# Patient Record
Sex: Male | Born: 1980 | Race: Black or African American | Hispanic: No | Marital: Single | Smoking: Never smoker
Health system: Southern US, Community
[De-identification: ages and names within clinical notes are randomized; demographics above are authoritative.]

## PROBLEM LIST (undated history)

## (undated) DIAGNOSIS — N200 Calculus of kidney: Secondary | ICD-10-CM

## (undated) DIAGNOSIS — B009 Herpesviral infection, unspecified: Secondary | ICD-10-CM

## (undated) HISTORY — DX: Herpesviral infection, unspecified: B00.9

## (undated) HISTORY — DX: Calculus of kidney: N20.0

## (undated) HISTORY — PX: NO PAST SURGERIES: SHX2092

---

## 2016-10-25 ENCOUNTER — Ambulatory Visit (INDEPENDENT_AMBULATORY_CARE_PROVIDER_SITE_OTHER): Payer: 59 | Admitting: Family Medicine

## 2016-10-25 ENCOUNTER — Telehealth: Payer: Self-pay | Admitting: Family Medicine

## 2016-10-25 ENCOUNTER — Encounter: Payer: Self-pay | Admitting: Family Medicine

## 2016-10-25 VITALS — BP 110/75 | HR 64 | Temp 97.8°F | Ht 63.75 in | Wt 179.2 lb

## 2016-10-25 DIAGNOSIS — Z09 Encounter for follow-up examination after completed treatment for conditions other than malignant neoplasm: Secondary | ICD-10-CM

## 2016-10-25 DIAGNOSIS — R0602 Shortness of breath: Secondary | ICD-10-CM | POA: Diagnosis not present

## 2016-10-25 DIAGNOSIS — Z23 Encounter for immunization: Secondary | ICD-10-CM | POA: Diagnosis not present

## 2016-10-25 NOTE — Telephone Encounter (Signed)
Patient dropped off FMLA form forward to paperwork bin

## 2016-10-25 NOTE — Patient Instructions (Signed)
Stay well hydrated with water.

## 2016-10-25 NOTE — Progress Notes (Signed)
Chief Complaint  Patient presents with  . Establish Care    pt was seen at [facility] last Thurs night for kidney stones-pt stated that he did pass the stone.       New Patient Visit SUBJECTIVE: HPI: Vernon Francis is an 36 y.o.male who is being seen for establishing care.  Seen at [facility] ED for L flank pain, found to have kidney stone. He passed one while giving a urine sample and passed another later. He feels much improved after that visit and is not having any urinary complaints.  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 He will sometimes sit under a vent at work and will have shortness of breath. He has asked his supervisor to move, however is not being accommodated. He does not have a hx of asthma or COPD, he does not smoke. He does not have shortness of breath other than when he sits under this vent. He is requesting a letter.  No Known Allergies  Past Medical History:  Diagnosis Date  . Kidney stones    Past Surgical History:  Procedure Laterality Date  . NO PAST SURGERIES     Social History   Social History  . Marital status: Single   Social History Main Topics  . Smoking status: Never Smoker  . Smokeless tobacco: Never Used  . Alcohol use No  . Drug use: No   Family History  Problem Relation Age of Onset  . Hypertension Mother   . Diabetes Father      Current Outpatient Prescriptions:  .  HYDROcodone-acetaminophen (NORCO/VICODIN) 5-325 MG tablet, Take 1 tablet by mouth as needed. , Disp: , Rfl:  .  ibuprofen (ADVIL,MOTRIN) 200 MG tablet, Take 200 mg by mouth every 6 (six) hours as needed., Disp: , Rfl:   ROS Resp: No current cough or SOB  GU:  Denies dysuria, hematuria   OBJECTIVE: BP 110/75 (BP Location: Left Arm, Patient Position: Sitting, Cuff Size: Large)   Pulse 64   Temp 97.8 F (36.6 C) (Oral)   Ht 5' 3.75" (1.619 m)   Wt 179 lb 3.2 oz (81.3 kg)   SpO2 99%   BMI 31.00 kg/m   Constitutional: -  VS reviewed -  Well  developed, well nourished, appears stated age -  No apparent distress  Psychiatric: -  Oriented to person, place, and time -  Memory intact -  Affect and mood normal -  Fluent conversation, good eye contact -  Judgment and insight age appropriate  Eye: -  Conjunctivae clear, no discharge -  Pupils symmetric, round, reactive to light  ENMT: -  Oral mucosa without lesions, tongue and uvula midline    Tonsils not enlarged, no erythema, no exudate, trachea midline    Pharynx moist, no lesions, no erythema  Neck: -  No gross swelling, no palpable masses -  Thyroid midline, not enlarged, mobile, no palpable masses  Cardiovascular: -  RRR, no murmurs -  No LE edema  Respiratory: -  Normal respiratory effort, no accessory muscle use, no retraction -  Breath sounds equal, no wheezes, no ronchi, no crackles  Gastrointestinal: -  Bowel sounds normal -  No tenderness, no distention, no guarding, no masses  Musculoskeletal: -  No clubbing, no cyanosis -  Gait normal -  No CVA tenderness  Skin: -  +L forearm body art with keloid formation -  Warm and dry to palpation   ASSESSMENT/PLAN: Follow-up for resolved condition  Shortness of breath  Patient instructed to sign release of records form from his previous PCP. Renal stone issue resolved. He has f/u with urology on Monday. Letter for work given stating that if he is having SOB from sitting under a vent, it would be beneficial for him to move to an area where the vent is not present and he is not having shortness of breath. Up date Tdap.  Patient should return in 1 year or prn. The patient voiced understanding and agreement to the plan.   Jilda Rocheicholas Paul IndianolaWendling, DO 10/25/16  8:29 AM

## 2016-10-25 NOTE — Addendum Note (Signed)
Addended by: Verdie ShireBAYNES, Jonothan Heberle M on: 10/25/2016 09:20 AM   Modules accepted: Orders

## 2016-10-29 DIAGNOSIS — N200 Calculus of kidney: Secondary | ICD-10-CM | POA: Insufficient documentation

## 2016-11-01 ENCOUNTER — Telehealth: Payer: Self-pay | Admitting: Family Medicine

## 2016-11-01 NOTE — Telephone Encounter (Signed)
[facility] [phone]) asking the pt to RTC regarding message below.//AB/CMA

## 2016-11-01 NOTE — Telephone Encounter (Signed)
Have him come in for appt. I thought this was a resolved issue. TY.

## 2016-11-01 NOTE — Telephone Encounter (Addendum)
Relation to EA:VWUJpt:self Call back [phone] Pharmacy:  Reason for call:  Patient was seen on 10/25/16 stating back pain has not improved, patient states HYDROcodone-acetaminophen (NORCO/VICODIN) 5-325 MG tablet  Is working and will run out soon, patient states he has a follow up U/S in 2 weeks to determine if 2nd kidney stone passed, please advise

## 2016-11-05 NOTE — Telephone Encounter (Signed)
[facility] @ 6:57pm @ [phone]) asking the pt to RTC regarding the message below.//AB/CMA

## 2016-11-05 NOTE — Telephone Encounter (Signed)
[facility] 7:56am @ [phone]) asking the pt to RTC regarding med refill request.//AB/CMA

## 2016-11-05 NOTE — Telephone Encounter (Signed)
Called and spoke with the pt and informed him of Dr. Hollie BeachWendling's message below.  Pt stated that he has an appt for a US on this Wed, which was ordered by [place]. [place]Hall [place].  After the US he will have an appt with Dr. Margo AyeHall.  All this took place on his appt with Dr. Margo AyeHall on (10/29/16).  Pt stated that he has 1 pill left.  Please advise.//AB/CMA

## 2016-11-05 NOTE — Telephone Encounter (Signed)
Then I will defer to Dr. Margo AyeHall for this. TY.

## 2016-11-07 NOTE — Telephone Encounter (Signed)
Patient returning call.

## 2016-11-08 NOTE — Telephone Encounter (Signed)
Unable to reach the pt by phone (playing phone tag with the pt).   LMOM @ 11:00am @ [phone]) apologizing to the pt for missing his calls.  LM informing the pt of the message below from Dr. Carmelia RollerWendling regarding the medication refill request.  Asked the pt to please give me a call back to let me know he received the message, and if he has any questions or concerns.//AB/CMA

## 2016-11-08 NOTE — Telephone Encounter (Signed)
Pt called back to say he received the message and there where not questions or concerns.  He stated that he has seen Dr. Margo AyeHall and everything was okay and the pain in his back is not coming from kidney stones.   Pt stated that the pain is just regular back pain which maybe coming from sitting at work.  He stated he has been taking Tylenol, so he does not need the Hydrocodone-acetaminophen.  Informed the pt if the Tylenol does not help then he will need to schedule an appt with Dr. Carmelia RollerWendling to evaluate his back pain.  Pt verbalized understanding and agreed.//AB/CMA

## 2016-11-14 NOTE — Telephone Encounter (Addendum)
Patient checking on the status of FMLA paperwork best # [phone], patient states deadline passed, please advise

## 2016-11-14 NOTE — Telephone Encounter (Signed)
Paperwork not received.  Called to make patient aware and request for another copy.  Left message for call back.

## 2016-11-14 NOTE — Telephone Encounter (Signed)
Pt returned called.  Pt made aware that paperwork was not received.  Pt stated he would either fax us another copy or drop it off first thing tomorrow morning.

## 2016-11-15 ENCOUNTER — Telehealth: Payer: Self-pay

## 2016-11-15 DIAGNOSIS — Z0279 Encounter for issue of other medical certificate: Secondary | ICD-10-CM

## 2016-11-15 NOTE — Telephone Encounter (Signed)
I have created letter for patient stating FMLA forms have been misplaced. I have attempted to contact patient to inform him that letter is in front office for pick up TL/CMA

## 2016-11-15 NOTE — Telephone Encounter (Addendum)
Called patient to make him aware that we received paperwork and has been forwarded to his provider.  Reason for FMLA:  Kidney stones for the 26th and 1/2 day for the 29th.  Pt also mentioned that he continues to experience intermittent right knee pain and back pain with prolonged sitting.  Pt states he's willing to come back in for an evaluation, but wanted to make sure provider was aware.

## 2016-11-15 NOTE — Telephone Encounter (Signed)
Patient dropped off FLMA forms again and physically handed to  [facility] RN. Patient requesting a letter from PCP stating that FMLA paperwork was missed placed. Patient states letter is  important due to patient being penalized 2 points from work if letter is not done. Patient will pick up FMLA paperwork when ready. Best [phone]

## 2016-12-12 ENCOUNTER — Ambulatory Visit (INDEPENDENT_AMBULATORY_CARE_PROVIDER_SITE_OTHER): Payer: 59 | Admitting: Family

## 2016-12-12 VITALS — BP 116/80 | HR 67 | Temp 98.0°F | Resp 18 | Ht 63.75 in | Wt 183.0 lb

## 2016-12-12 DIAGNOSIS — M25561 Pain in right knee: Secondary | ICD-10-CM | POA: Diagnosis not present

## 2016-12-12 MED ORDER — MELOXICAM 7.5 MG PO TABS: 7.5000 mg | ORAL_TABLET | Freq: Every day | ORAL | 0 refills | Status: DC

## 2016-12-12 NOTE — Patient Instructions (Addendum)
Please begin meloxicam once daily for knee pain.  You will be contacted about your referral to sports medicine.

## 2016-12-12 NOTE — Progress Notes (Signed)
Pre visit review using our clinic review tool, if applicable. No additional management support is needed unless otherwise documented below in the visit note. 
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
## 2016-12-14 ENCOUNTER — Telehealth: Payer: Self-pay | Admitting: *Deleted

## 2016-12-14 NOTE — Telephone Encounter (Signed)
Received completed and signed FMLA from Vernon Francis.  Spoke with the pt and informed him that his FMLA paperwork was completed and ready.  Pt stated that he will pick up the paperwork on (Mon-12/17/16).  Informed the pt that I will leave the forms up front.  Pt agreed.  Copy made to be scanned in the chart./AB/CMA

## 2016-12-14 NOTE — Telephone Encounter (Signed)
[facility] and [facility] @ 3:06pm @ [phone]) asking the pt to RTC regarding FMLA paperwork.//AB/CMA

## 2016-12-18 ENCOUNTER — Encounter: Payer: Self-pay | Admitting: Family Medicine

## 2016-12-18 ENCOUNTER — Ambulatory Visit (INDEPENDENT_AMBULATORY_CARE_PROVIDER_SITE_OTHER): Payer: 59 | Admitting: Family Medicine

## 2016-12-18 DIAGNOSIS — G8929 Other chronic pain: Secondary | ICD-10-CM | POA: Diagnosis not present

## 2016-12-18 DIAGNOSIS — M25561 Pain in right knee: Secondary | ICD-10-CM | POA: Diagnosis not present

## 2016-12-18 MED ORDER — DICLOFENAC SODIUM 75 MG PO TBEC: 75.0000 mg | DELAYED_RELEASE_TABLET | Freq: Two times a day (BID) | ORAL | 1 refills | Status: DC

## 2016-12-18 NOTE — Patient Instructions (Signed)
Your knee exam suggests patellar subluxation and small medial meniscus tear as causes of your pain. The rest of your exam is reassuring. Take diclofenac 75mg  twice a day with food for pain and inflammation for 7-10 days then as needed. Icing only if needed 15 minutes at a time. Knee brace or sleeve when up and walking around for support. Start physical therapy and do home exercises on days you don't go to therapy. Follow up with me in 6 weeks. If not improving as expected would consider MRI at that point.

## 2016-12-19 DIAGNOSIS — M25561 Pain in right knee: Secondary | ICD-10-CM | POA: Insufficient documentation

## 2016-12-19 NOTE — Assessment & Plan Note (Signed)
exam suggests patellar subluxation and medial meniscus tear.  Other ligamentous structures intact.  Start with diclofenac 75mg  twice a day with food, icing, knee brace or sleeve, and physical therapy.  F/u in 6 weeks.  Consider MRI if not improving as expected.
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
## 2016-12-27 NOTE — Telephone Encounter (Signed)
Pt came in office stating was missing page 4 and 5 with the [facility] paperwork. Pt dropped off copies of the entire package of FMLA but is only needing page 4 to be filled out and needing copy of page 5 that was already filed out but did not have a copy of it. Please call pt when document ready at [facility] [phone]. Documents given to [place].

## 2017-01-02 NOTE — Telephone Encounter (Signed)
Pt called requesting status of FMLA. Forms updated and placed at front desk for pt to pick up. Pt has been made aware. Copy placed in red folder for paperwork processing.

## 2017-01-02 NOTE — Telephone Encounter (Signed)
Patient informed FMLA paperwork will be ready for pick up today, patient voice understanding.

## 2017-01-29 ENCOUNTER — Ambulatory Visit: Payer: 59 | Admitting: Family Medicine

## 2017-06-17 ENCOUNTER — Telehealth: Payer: Self-pay | Admitting: Family Medicine

## 2017-06-17 ENCOUNTER — Encounter: Payer: Self-pay | Admitting: Family Medicine

## 2017-06-17 ENCOUNTER — Ambulatory Visit (INDEPENDENT_AMBULATORY_CARE_PROVIDER_SITE_OTHER): Payer: 59 | Admitting: Family Medicine

## 2017-06-17 VITALS — BP 120/72 | HR 64 | Temp 98.5°F | Ht 64.0 in | Wt 180.2 lb

## 2017-06-17 DIAGNOSIS — M25562 Pain in left knee: Secondary | ICD-10-CM | POA: Diagnosis not present

## 2017-06-17 DIAGNOSIS — M25561 Pain in right knee: Secondary | ICD-10-CM

## 2017-06-17 DIAGNOSIS — G8929 Other chronic pain: Secondary | ICD-10-CM | POA: Diagnosis not present

## 2017-06-17 NOTE — Patient Instructions (Signed)
If you do not hear anything about your physical therapy referral in the next 1-2 weeks, call our office and ask for an update.  Stretching and range of motion exercises These exercises warm up your muscles and joints and improve the movement and flexibility of your knee. These exercises also help to relieve pain and stiffness.  Exercise A: Knee flexion, active 1. Lie on your back with both knees straight. If this causes back discomfort, bend your uninjured knee so your foot is flat on the floor. 2. Slowly slide your left / right heel back toward your buttocks until you feel a gentle stretch in the front of your knee or thigh. Stop if you have pain. 3. Hold for 15-20 seconds. 4. Slowly slide your left / right heel back to the starting position. Repeat 2-3 times. Complete this exercise 1 time a day.  Exercise B: Knee extension, sitting 1. Sit with your left / right heel propped on a chair, a coffee table, or a footstool. Do not have anything under your knee to support it. 2. Allow your leg muscles to relax, letting gravity straighten out your knee. You should feel a stretch behind your left / right knee. 3. If told by your health care provide just above your kneecap. 4. Hold this position for 15-20 seconds. Repeat 2-3 times. Complete this stretch 1 time a day.  Strengthening exercises These exercises build strength and endurance in your knee. Endurance is the ability to use your muscles for a long time, even after they get tired.  Exercise C: Quadriceps, isometric 1. Lie on your back with your left / right leg extended and your other knee bent. Put a rolled towel or small pillow under your right/left knee if told by your health care provider. 2. Slowly tense the muscles in the front of your left / right thigh by pushing the back of your knee down. You should see your knee cap slide up toward your hip or see increased dimpling just above the knee. 3. For 15-20 seconds, keep the muscle as tight  as you can without increasing your pain. 4. Relax the muscles slowly and completely. Repeat 2-3 times. Complete this exercise 1 time a day. Exercise D: Straight leg raises (quadriceps) 1. Lie on your back with your left / right leg extended and your other knee bent. 2. Tense the muscles in the front of your left / right thigh. You should see your kneecap slide up or see increased dimpling just above the knee. 3. Keep these muscles tight as you raise your leg 4-6 inches (10-15 cm) off the floor. 4. Hold this position for 15-20 seconds. 5. Keep these muscles tense as you lower your leg. 6. Relax the muscles slowly and completely. Repeat 2-3 times. Complete this exercise 1 time a day.  Exercise E: Hamstring curls 1. On the floor or a bed, lie on your abdomen with your legs straight. Put a folded towel or small pillow under your left / right thigh, just above your kneecap. 2. Slowly bend your left / right knee as far as you can without pain. Keep your hips flat against the floor or bed. 3. Hold this position for 15-20 seconds. 4. Slowly lower your leg to the starting position. Repeat 2-3 times. Complete this exercise 1 time a day.  Stretching exercises These exercises warm up your muscles and joints and improve the movement and flexibility of your knee. These exercises also help to relieve pain and stiffness.  Exercise A:  Quadriceps, prone 1. Lie on your abdomen on a firm surface, such as a bed or padded floor. 2. Bend your left / right knee and hold your ankle. If you cannot reach your ankle or pant leg, loop a belt around your foot and grab the belt instead. 3. Gently pull your heel toward your buttocks. Your knee should not slide out to the side. You should feel a stretch in the front of your thigh and knee. 4. Hold this position for 15-20 seconds. Repeat 2-3 times. Complete this stretch 1-2 times a day.  Exercise B: Hamstring, doorway 1. Lie on your back in front of a doorway with your  left / right leg resting against the wall and your other leg flat on the floor in the doorway. There should be a slight bend in your left / right knee. 2. Straighten your left / right knee. You should feel a stretch behind your knee or thigh. If you do not feel that stretch, scoot your buttocks closer to the door. 3. Hold this position for 15-20 seconds. Repeat 2-3 times. Complete this stretch 1-2 times a day.  Strengthening exercises These exercises build strength and endurance in your knee and leg muscles. Endurance is the ability to use your muscles for a long time, even after they get tired. Exercise C: Step-ups (quadriceps) 1. Stand in front of a step, a thick book, or a step stool that is ~2-3 feet tall. 2. Slowly step up with your left / right foot, keeping your knee in line with your hip and foot. Do not let your knee bend so far that you cannot see your toes. Hold a wall or a counter for balance, but do not use it for support. 3. Slowly unlock your knee and lower yourself to the starting position using gravity, not your muscles. Repeat 2-3 times. Complete this exercise 1 time a day.  Exercise D: Wall slides (quadriceps) 1. Lean your back against a smooth wall or door, and walk your feet out 18-24 inches (45-61 cm) from it. 2. Place your feet hip-width apart. 3. Slowly slide down the wall or door until your knees bend 90 degrees. Keep your knees over your heels, not over your toes. Keep your knees in line with your hips. 4. Hold for 15-20 seconds. 5. Stand up to rest for 60 seconds. Repeat 2-3 times. Complete this exercise every other day.  Exercise E: Bridge (hip extensors) 1. Lie on your back on a firm surface with your knees bent and your feet flat on the floor. 2. Tighten your buttocks muscles and lift your bottom off the floor until your trunk is level with your thighs. ? Do not arch your back. ? You should feel the muscles working in your buttocks and the back of your  thighs. 3. Hold this position for 15-20 seconds. 4. Slowly lower your hips to the starting position. 5. Let your buttocks muscles relax completely between repetitions. Repeat 2-3 times. Complete this exercise 1 time a day.  Balance exercises This exercise improves or maintains your balance. Balance is important in preventing falls. Exercise F: Single leg stand 1. Without shoes, stand near a railing or in a doorway. You may hold onto the railing or door frame as needed. 2. Stand on your left / right foot. Keep your knee slightly bent and keep your big toe down on the floor. 3. If this exercise is too easy, you can try doing it with your eyes closed or while standing on  a pillow. 4. Hold this position for 15-20 seconds. Repeat 2-3 times. Complete this exercise 1 time a day.  This information is not intended to replace advice given to you by your health care provider. Make sure you discuss any questions you have with your health care provider.
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
## 2017-06-17 NOTE — Telephone Encounter (Signed)
Pt dropped off FMLA paperwork for Dr. Carmelia Roller to fill out, documents placed in tray at front office

## 2017-06-17 NOTE — Progress Notes (Signed)
Pre visit review using our clinic review tool, if applicable. No additional management support is needed unless otherwise documented below in the visit note. 

## 2017-06-19 NOTE — Telephone Encounter (Signed)
Patient returning call and states he would like to speak with you directly and will stop in the office tomorrow, patient states you call call him in the morning.

## 2017-06-19 NOTE — Telephone Encounter (Signed)
Completed as much as possible of paperwork; LMOM with contact name and number for return call from patient RE: to give information as to what he is needing on forms. Will hold until I receive return call/SLS 09/26

## 2017-06-19 NOTE — Telephone Encounter (Signed)
Relation to ZO:XWRU  Call back number: [phone]   Reason for call:  Patient requesting form reflect back date time patient states the date is on the form please speak with patient directly to clarify, please advise

## 2017-06-21 NOTE — Telephone Encounter (Signed)
Spoke to patient via phone about updates he needed on forms; I spoke with Dr. Carmelia Roller about these and they were made and forms completed with provider signature; I will have [place] email forms when she returns to office on 06/21/17//SLS 09/27

## 2017-06-26 ENCOUNTER — Ambulatory Visit: Payer: 59 | Admitting: Physical Therapy

## 2017-07-01 NOTE — Telephone Encounter (Signed)
Was unable to email the form as the link was for a scan in, therefore; I mailed the forms in, as this was the only other alternative on form

## 2017-07-02 ENCOUNTER — Ambulatory Visit: Payer: 59 | Attending: Family Medicine | Admitting: Physical Therapy

## 2017-07-02 DIAGNOSIS — M6281 Muscle weakness (generalized): Secondary | ICD-10-CM | POA: Insufficient documentation

## 2017-07-02 DIAGNOSIS — M25562 Pain in left knee: Secondary | ICD-10-CM | POA: Diagnosis present

## 2017-07-02 DIAGNOSIS — G8929 Other chronic pain: Secondary | ICD-10-CM | POA: Insufficient documentation

## 2017-07-02 DIAGNOSIS — M62838 Other muscle spasm: Secondary | ICD-10-CM | POA: Diagnosis present

## 2017-07-02 DIAGNOSIS — M25561 Pain in right knee: Secondary | ICD-10-CM | POA: Diagnosis not present
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
## 2017-07-04 NOTE — Telephone Encounter (Signed)
Vernon Francis Self [phone]  Vernon Francis would like you to call him back about this FMLA paperwork.

## 2017-07-04 NOTE — Telephone Encounter (Signed)
Patient emailed letter requesting additional information, email forwarded to sharon on 07/02/17 patient checking on the status,please advise

## 2017-07-05 ENCOUNTER — Ambulatory Visit: Payer: 59

## 2017-07-05 NOTE — Telephone Encounter (Signed)
Letter received via email is in reference to an absence from work on 05/17/17; I do not find a telephone note and/or OV for that date. Forwarded to provider for advisement on this matter/SLS 10/11

## 2017-07-05 NOTE — Telephone Encounter (Signed)
Called and spoke with patient about FMLA paperwork and explained that received letter from [employer] stating is was about an 05/17/17 absence. Explained that that date would be covered under the FMLA that Melissa completed for him in March [6-mths], Pt states that Melissa had to write a note that stated that "paperwork was filled out incorrectly" [not a letter that I can refer back to]. I also informed him that we cannot attest to an absence on that date because Dr. Carmelia Roller did not see patient in office until 06/17/17, but he did grant pt's request to note that this medical issue has been going on since 12/13/16 on additional information. Pt will contact his disability rep and try to get more clarification and call back with the information/SLS 10/12

## 2017-07-09 ENCOUNTER — Ambulatory Visit: Payer: 59 | Admitting: Physical Therapy

## 2017-07-12 ENCOUNTER — Ambulatory Visit: Payer: 59

## 2017-07-17 ENCOUNTER — Ambulatory Visit: Payer: 59 | Admitting: Physical Therapy

## 2017-07-17 DIAGNOSIS — M25561 Pain in right knee: Principal | ICD-10-CM

## 2017-07-17 DIAGNOSIS — M25562 Pain in left knee: Secondary | ICD-10-CM

## 2017-07-17 DIAGNOSIS — M62838 Other muscle spasm: Secondary | ICD-10-CM

## 2017-07-17 DIAGNOSIS — G8929 Other chronic pain: Secondary | ICD-10-CM

## 2017-07-17 DIAGNOSIS — M6281 Muscle weakness (generalized): Secondary | ICD-10-CM

## 2017-07-17 NOTE — Therapy (Addendum)
[facility] [address]  [place], [place], [address] Phone: [phone]   Fax:  [phone]  Physical Therapy Treatment  Patient Details  Name: Vernon Francis MRN: [id number] Date of Birth: Aug 16, 1981 Referring Provider: Riki Sheer, MD  Encounter Date: 07/17/2017      PT End of Session - 07/17/17 0851    Visit Number 2   Number of Visits 12   Date for PT Re-Evaluation 08/16/17   Authorization Type UHC   PT Start Time 0851   PT Stop Time 0937   PT Time Calculation (min) 46 min   Activity Tolerance Patient tolerated treatment well   Behavior During Therapy [facility] for tasks assessed/performed      Past Medical History:  Diagnosis Date  . Kidney stones     Past Surgical History:  Procedure Laterality Date  . NO PAST SURGERIES      There were no vitals filed for this visit.      Subjective Assessment - 07/17/17 0854    Subjective Pt reports pain is less than when he first came to PT. Has started working out at the gym again. Notes pain typically after working out. Reports trying to do the HEP stretches daily.   Patient Stated Goals "see if I can get the pain to go away"   Currently in Pain? No/denies   Pain Score 0-No pain   Pain Onset More than a month ago                         [facility] Adult PT Treatment/Exercise - 07/17/17 0851      Exercises   Exercises Knee/Hip     Knee/Hip Exercises: Stretches   Passive Hamstring Stretch Both;30 seconds;2 reps   Passive Hamstring Stretch Limitations supine with strap; strap at ball of foot with pull in DF for gastroc stretch   Quad Stretch Both;30 seconds;2 reps   Quad Stretch Limitations prone with strap & pillow under knee to emphasize RF & hip flexors   ITB Stretch Both;30 seconds;2 reps   ITB Stretch Limitations supine with strap   Piriformis Stretch Both;30 seconds;2 reps   Piriformis Stretch Limitations supine figure 4   Other  Knee/Hip Stretches foam rolling: B med/lat HS, B piriformis, B 3 way quads & B ITB x ~1' each     Knee/Hip Exercises: Aerobic   Recumbent Bike lvl 2 x 6'     Knee/Hip Exercises: Supine   Bridges with Ball Squeeze Both;15 reps;Strengthening  5" hold   Straight Leg Raise with External Rotation Both;15 reps;Strengthening   Straight Leg Raise with External Rotation Limitations 3#     Knee/Hip Exercises: Sidelying   Hip ADduction Both;15 reps;Strengthening   Hip ADduction Limitations 3#                PT Education - 07/17/17 0946    Education provided Yes   Education Details HEP update - foam rolling & initial strengthening program   Person(s) Educated Patient   Methods Explanation;Demonstration;Handout   Comprehension Verbalized understanding;Returned demonstration;Need further instruction             PT Long Term Goals - 07/17/17 0855      PT LONG TERM GOAL #1   Title Independent with ongoing HEP   Status On-going     PT LONG TERM GOAL #2   Title B hip and knee strength >/= 4+/5 w/o pain on resistance  Status On-going     PT LONG TERM GOAL #3   Title Pt will report ability to work 1/2 day at desk w/o limitation due to B knee pain   Status On-going     PT LONG TERM GOAL #4   Title Pt will resume normal workout w/o limitation due to B knee pain   Status On-going               Plan - 07/17/17 0856    Clinical Impression Statement Pt returning for first time over 2 weeks since eval. Reports good compliance with HEP stretches and improving flexibility noted, although still tightest in hip flexors & ITB. Introduced foam rolling to further reduce muscle tension/tightness. Reports pain is better, but still experiencing pain typically after working out at the gym. Initiated basic strengthening program and will progress as of next visit.   Rehab Potential Good   PT Treatment/Interventions Patient/family education;ADLs/Self Care Home Management;Neuromuscular  re-education;Therapeutic exercise;Manual techniques;Dry needling;Taping;Therapeutic activities;Electrical Stimulation;Cryotherapy;Vasopneumatic Device;Iontophoresis 33m/ml Dexamethasone   Consulted and Agree with Plan of Care Patient      Patient will benefit from skilled therapeutic intervention in order to improve the following deficits and impairments:  Pain, Impaired flexibility, Impaired tone, Increased muscle spasms, Decreased strength, Decreased range of motion, Postural dysfunction  Visit Diagnosis: Chronic pain of right knee  Chronic pain of left knee  Other muscle spasm  Muscle weakness (generalized)     Problem List Patient Active Problem List   Diagnosis Date Noted  . Right knee pain 12/19/2016  . Nephrolithiasis 10/29/2016    JPercival Spanish PT, MPT 07/17/2017, 9:51 AM  [facility] [place] [address]Phone: [phone]  Fax:  [phone] Name: Vernon KilgallonMRN: [id number] of Birth: 105-Dec-1982  PHYSICAL THERAPY DISCHARGE SUMMARY  Visits from Start of Care: 2  Current functional level related to goals / functional outcomes:   Unable to formally assess as pt failed to return for further visits beyond initial treatment visit. Pt being discharged per policy  due to >3 NS/Cx.   Remaining deficits:   As above.   Education / Equipment:   HEP  Plan: Patient agrees to discharge.  Patient goals were not met. Patient is being discharged due to not returning since the last visit.  ?????     JPercival Spanish PT, MPT 08/06/17, 8:24 AM  [facility] [place] [address]Phone: [phone]  Fax:  [phone]

## 2017-07-22 ENCOUNTER — Ambulatory Visit: Payer: 59 | Admitting: Physical Therapy

## 2017-07-25 ENCOUNTER — Ambulatory Visit: Payer: 59 | Attending: Family Medicine | Admitting: Physical Therapy

## 2017-09-11 ENCOUNTER — Ambulatory Visit (INDEPENDENT_AMBULATORY_CARE_PROVIDER_SITE_OTHER): Payer: PRIVATE HEALTH INSURANCE | Admitting: Licensed Clinical Social Worker

## 2017-09-11 DIAGNOSIS — F321 Major depressive disorder, single episode, moderate: Secondary | ICD-10-CM

## 2017-10-14 ENCOUNTER — Ambulatory Visit (INDEPENDENT_AMBULATORY_CARE_PROVIDER_SITE_OTHER): Payer: PRIVATE HEALTH INSURANCE | Admitting: Licensed Clinical Social Worker

## 2017-10-14 DIAGNOSIS — F321 Major depressive disorder, single episode, moderate: Secondary | ICD-10-CM | POA: Diagnosis not present

## 2017-10-16 ENCOUNTER — Encounter: Payer: Self-pay | Admitting: Family Medicine

## 2017-10-16 ENCOUNTER — Ambulatory Visit (INDEPENDENT_AMBULATORY_CARE_PROVIDER_SITE_OTHER): Payer: 59 | Admitting: Family Medicine

## 2017-10-16 VITALS — BP 112/78 | HR 65 | Temp 98.4°F | Ht 64.0 in | Wt 186.0 lb

## 2017-10-16 DIAGNOSIS — F418 Other specified anxiety disorders: Secondary | ICD-10-CM | POA: Diagnosis not present

## 2017-10-16 NOTE — Patient Instructions (Addendum)
Please consider counseling. Contact [phone] to schedule an appointment or inquire about cost/insurance coverage.  Contact your team at work regarding getting us any necessary paperwork.   Coping skills Choose 5 that work for you:  Take a deep breath  Count to 20  Read a book  Do a puzzle  Meditate  Bake  Sing  Knit  Garden  Pray  Go outside  Call a friend  Listen to music  Take a walk  Color  Send a note  Take a bath  Watch a movie  Be alone in a quiet place  Pet an animal  Visit a friend  Journal  Exercise  Stretch   Let us know if you need anything.

## 2017-10-16 NOTE — Progress Notes (Signed)
Chief Complaint  Patient presents with  . Follow-up    Subjective: Patient is a 37 y.o. male here for anxiety.  This issue has been going on for approximately 1 year.  Almost 3 months ago, he had his first born son and ever since then, things have been getting more stressful in his life.  His appetite will fluctuate and his sleep is poor.  He has some feelings of depression and anxiety, however his performance at work is being affected the most.  Concentration is not good.  There is a program through his job that allows him to take 30 or 60 days off to see if his health can improve.  He wishes to try to take 30 days off rather than start a medication.  He is open to the latter option if issues immediately return after going back to work.  Objective: BP 112/78 (BP Location: Left Arm, Patient Position: Sitting, Cuff Size: Normal)   Pulse 65   Temp 98.4 F (36.9 C) (Oral)   Ht 5\' 4"  (1.626 m)   Wt 186 lb (84.4 kg)   SpO2 97%   BMI 31.93 kg/m  General: Awake, appears stated age Lungs: No accessory muscle use Psych: Age appropriate judgment and insight, normal affect and mood  Assessment and Plan: Situational anxiety  We discussed medication, I am okay if he wishes to hold off on this. # for [facility] provided as well as options for managing feelings of anxiety. OK to fill out form, he will let us know.  F/u in 8 weeks, this should give me enough time to fill out his form and for him to try to have 30 days off.  I would like to see him after he has returned to work. The patient voiced understanding and agreement to the plan.  Jilda Rocheicholas Paul FowlertonW[place], DO 10/16/17  8:43 AM

## 2017-10-16 NOTE — Progress Notes (Signed)
Pre visit review using our clinic review tool, if applicable. No additional management support is needed unless otherwise documented below in the visit note. 

## 2017-10-17 ENCOUNTER — Telehealth: Payer: Self-pay | Admitting: Family Medicine

## 2017-10-17 NOTE — Telephone Encounter (Signed)
Pt dropped off disability paperwork, documents placed in tray at front office

## 2017-10-22 NOTE — Telephone Encounter (Signed)
Numbered paperwork and forwarded to provider d/t the [employer] paperwork stating, "disability claim requesting ... absence commencing 10/25/2017 [?]/SLS 01/29

## 2017-10-23 ENCOUNTER — Telehealth: Payer: Self-pay | Admitting: *Deleted

## 2017-10-23 NOTE — Telephone Encounter (Signed)
[facility]; forwarded to provider/SLS 01/30

## 2017-10-25 NOTE — Telephone Encounter (Signed)
Paperwork faxed °

## 2017-10-25 NOTE — Telephone Encounter (Signed)
LMOM with contact name and number [for return call, if needed] RE: paperwork ready for p/u during regular business hours/SLS 02/01

## 2017-11-07 ENCOUNTER — Ambulatory Visit: Payer: Self-pay | Admitting: Family Medicine

## 2017-11-07 NOTE — Telephone Encounter (Signed)
Pt called in to follow up on paperwork. Pt says that he was told by Disability that he dropped off the incorrect form. [employer] faxed over the correct form today. Pt would like to make provider aware to be looking out for it for completion. Pt says that form need to be sent over to therapist Berniece AndreasJulie Whitt also for completion. Pt says that he made front office aware when he dropped off previous paperwork.    Did make pt aware that other paperwork is up front for pick up.   Please assist further.

## 2017-11-11 ENCOUNTER — Ambulatory Visit (INDEPENDENT_AMBULATORY_CARE_PROVIDER_SITE_OTHER): Payer: PRIVATE HEALTH INSURANCE | Admitting: Licensed Clinical Social Worker

## 2017-11-11 DIAGNOSIS — F321 Major depressive disorder, single episode, moderate: Secondary | ICD-10-CM | POA: Diagnosis not present

## 2017-11-14 ENCOUNTER — Telehealth: Payer: Self-pay | Admitting: *Deleted

## 2017-11-14 NOTE — Telephone Encounter (Signed)
Copied from CRM #[id number]. Topic: General - Other >> Nov 11, 2017 10:54 AM Percival [phone] Cheryl W wrote:  Pt call to ask if Dr Carmelia RollerWendling has [facility]. Pt was informed by her that the forms was sent on 11/07/17   Pt phone [phone]  >> Nov 13, 2017 11:05 AM Gerrianne ScalePayne, Angela L wrote: Patient calling back about his disability forms he want to know if the practice had received the form he states that it need to be sent in before 11-21-17

## 2017-11-14 NOTE — Telephone Encounter (Signed)
I do not have any forms except for the set that came in on 10/17/17 by patient and was completed and faxed out on 10/23/17. I did receive medical records from [facility]; forwarded to provider on 10/23/17:  Orlene Ermonversation  (Newest Message First)  Me    10/23/17 9:50 AM  Note    Received Medical records from [facility]; forwarded to provider/SLS 01/30

## 2017-11-15 ENCOUNTER — Ambulatory Visit: Payer: Self-pay | Admitting: Family Medicine

## 2017-11-15 NOTE — Telephone Encounter (Signed)
Received FMLA/STDmedical Request paperwork from [facility] claims Management, completed as much as possible; forwarded to provider [pt No Show &Cancel 02/22] /SLS02/22

## 2017-11-18 ENCOUNTER — Ambulatory Visit: Payer: Self-pay | Admitting: Internal Medicine

## 2017-11-18 ENCOUNTER — Ambulatory Visit (INDEPENDENT_AMBULATORY_CARE_PROVIDER_SITE_OTHER): Payer: PRIVATE HEALTH INSURANCE | Admitting: Licensed Clinical Social Worker

## 2017-11-18 ENCOUNTER — Ambulatory Visit: Payer: 59 | Admitting: Family Medicine

## 2017-11-18 DIAGNOSIS — F324 Major depressive disorder, single episode, in partial remission: Secondary | ICD-10-CM

## 2017-11-18 DIAGNOSIS — Z0289 Encounter for other administrative examinations: Secondary | ICD-10-CM

## 2017-11-19 ENCOUNTER — Encounter: Payer: Self-pay | Admitting: Family Medicine

## 2017-11-20 ENCOUNTER — Encounter: Payer: Self-pay | Admitting: Family Medicine

## 2017-11-20 ENCOUNTER — Ambulatory Visit (INDEPENDENT_AMBULATORY_CARE_PROVIDER_SITE_OTHER): Payer: 59 | Admitting: Family Medicine

## 2017-11-20 VITALS — BP 112/70 | HR 73 | Temp 98.2°F | Ht 64.0 in | Wt 183.4 lb

## 2017-11-20 DIAGNOSIS — J069 Acute upper respiratory infection, unspecified: Secondary | ICD-10-CM | POA: Diagnosis not present

## 2017-11-20 DIAGNOSIS — B9789 Other viral agents as the cause of diseases classified elsewhere: Secondary | ICD-10-CM | POA: Diagnosis not present

## 2017-11-20 LAB — POCT RAPID STREP A (OFFICE): RAPID STREP A SCREEN: NEGATIVE

## 2017-11-20 NOTE — Progress Notes (Signed)
Pre visit review using our clinic review tool, if applicable. No additional management support is needed unless otherwise documented below in the visit note. 

## 2017-11-20 NOTE — Patient Instructions (Signed)
Continue to push fluids, practice good hand hygiene, and cover your mouth if you cough.  If you start having fevers, shaking or shortness of breath, seek immediate care.  Ibuprofen 400-600 mg (2-3 over the counter strength tabs) every 6 hours as needed for pain.  OK to take Tylenol 1000 mg (2 extra strength tabs) or 975 mg (3 regular strength tabs) every 6 hours as needed.  Let us know if you need anything.  

## 2017-11-20 NOTE — Progress Notes (Signed)
Chief Complaint  Patient presents with  . Sore Throat  . Cough    Harlow MaresJonathan Perryman here for URI complaints.  Duration: 4 days  Associated symptoms: sinus congestion, rhinorrhea, sore throat and cough Denies: sinus pain, itchy watery eyes, ear pain, ear drainage, wheezing, shortness of breath, myalgia and fevers Treatment to date: lemon-honey, Day/Nyquil, Mucinex Sick contacts: Yes  ROS:  Const: Denies fevers HEENT: As noted in HPI Lungs: No SOB  Past Medical History:  Diagnosis Date  . Kidney stones    Family History  Problem Relation Age of Onset  . Hypertension Mother   . Diabetes Father     BP 112/70 (BP Location: Left Arm, Patient Position: Sitting, Cuff Size: Normal)   Pulse 73   Temp 98.2 F (36.8 C) (Oral)   Ht 5\' 4"  (1.626 m)   Wt 183 lb 6 oz (83.2 kg)   SpO2 97%   BMI 31.48 kg/m  General: Awake, alert, appears stated age HEENT: AT, [place], ears patent b/l and TM's neg, nares patent w/o discharge, pharynx mildly erythematous and without asymmetry/exudates, MMM Neck: No masses or asymmetry Heart: RRR Lungs: CTAB, no accessory muscle use Psych: Age appropriate judgment and insight, normal mood and affect  Viral URI with cough  Supp care, ibuprofen, Tylenol. Continue to push fluids, practice good hand hygiene, cover mouth when coughing. F/u prn. If starting to experience fevers, shaking, or shortness of breath, seek immediate care. Pt voiced understanding and agreement to the plan.  Jilda Rocheicholas Paul [place] Creek[place]end[place], DO 11/20/17 10:49 AM

## 2017-11-25 ENCOUNTER — Encounter: Payer: Self-pay | Admitting: Family Medicine

## 2017-11-25 ENCOUNTER — Ambulatory Visit (INDEPENDENT_AMBULATORY_CARE_PROVIDER_SITE_OTHER): Payer: 59 | Admitting: Family Medicine

## 2017-11-25 VITALS — BP 120/80 | HR 75 | Temp 98.4°F | Ht 64.0 in | Wt 183.0 lb

## 2017-11-25 DIAGNOSIS — F418 Other specified anxiety disorders: Secondary | ICD-10-CM

## 2017-11-25 NOTE — Progress Notes (Signed)
Pre visit review using our clinic review tool, if applicable. No additional management support is needed unless otherwise documented below in the visit note. 

## 2017-11-25 NOTE — Telephone Encounter (Signed)
Copied from CRM [phone]. Topic: Inquiry >> Nov 22, 2017  1:22 PM Windy KalataMichael, Taylor L, NT wrote: Patient has a apt on 11/25/17 with Dr. Carmelia RollerWendling. She said for me to inform the nurse to not send out his disability paper work until after the appt on 11/25/17. Please advise.  Paperwork has been forwarded to Dr. Carmelia RollerWendling for 11/25/17 appt; appears to be same paperwork received before, and a second copy received was placed in box [11/22/17] for [employer] Br[employer]ie Whitt, as instructed to do in previous note/SLS 03/04

## 2017-11-25 NOTE — Progress Notes (Signed)
Chief Complaint  Patient presents with  . Follow-up    paper work   Pt is here for f/u situational anxiety. He was seen in late Jan for this issue. He is currently following w the behavioral health team for counseling and is finding it helpful. He did not start medicine, is still not interested. He has a young son that serves as a stressor. He does not have any conflicts with ppl at work. He was initially wrote off for 30 days, but was only given 19. He is requesting another extension for 30 d in anticipation of getting another 5219. He is doing better but still dealing with "balancing things out".   ROS: Psych: +anxiety  BP 120/80 (BP Location: Left Arm, Patient Position: Sitting, Cuff Size: Normal)   Pulse 75   Temp 98.4 F (36.9 C) (Oral)   Ht 5\' 4"  (1.626 m)   Wt 183 lb (83 kg)   SpO2 95%   BMI 31.41 kg/m  Gen- awake, alert Lungs- No access muscle use Psych- Age appropriate judgment and insight, nml mood and affect  Situational anxiety  He again declines medication. OK to stay with counseling team. Will give him another 30 d, return on 12/29/17. Form filled out, will scan and send.  F/u prn.  Pt voiced understanding and agreement to the plan.  Jilda Rocheicholas Paul Wendling 12:06 PM 11/25/17

## 2017-11-26 ENCOUNTER — Ambulatory Visit (INDEPENDENT_AMBULATORY_CARE_PROVIDER_SITE_OTHER): Payer: PRIVATE HEALTH INSURANCE | Admitting: Licensed Clinical Social Worker

## 2017-11-26 DIAGNOSIS — F324 Major depressive disorder, single episode, in partial remission: Secondary | ICD-10-CM

## 2017-11-28 NOTE — Telephone Encounter (Signed)
Vernon Francis calling because they need more details as far as what was observed that demonstrates that he's disabled/impaired in mental status? They need this information from his PCP Dr. Carmelia RollerWendling. They will be contacting Vernon Francis as well, for additional information. Fax: [phone]

## 2017-11-29 NOTE — Telephone Encounter (Signed)
Pt is having situational anxiety which can affect concentration/organization. This is his biggest issue. He does not wish to go on medication for this and is following with a Veterinary surgeoncounselor. I do not have observations from OV's for this. TY.

## 2017-12-04 ENCOUNTER — Telehealth: Payer: Self-pay | Admitting: Family Medicine

## 2017-12-04 NOTE — Telephone Encounter (Signed)
Copied from CRM [id number]. Topic: Inquiry >> Dec 04, 2017  2:53 PM Windy KalataMichael, Taylor L, NT wrote: Dr. Emelda BrothersGivens is calling and would like to leave a message for Dr. Carmelia RollerWendling to return his call in regards to disability.  Cb# [phone]

## 2017-12-11 ENCOUNTER — Telehealth: Payer: Self-pay | Admitting: Family Medicine

## 2017-12-11 NOTE — Telephone Encounter (Signed)
Called number and left message. TY.

## 2017-12-11 NOTE — Telephone Encounter (Signed)
Patient is calling to confirm that the office received a fax from his job. He has a appt tomorrow 12/12/17 at 7/15am. He would like for the paper work to wait and be filled out while he is there. Thank you.

## 2017-12-11 NOTE — Telephone Encounter (Signed)
Pt called - and said that his fmla company has not gotten all the info needed for his fmla, and that if it is not received today, the pt will lose his job.  I had the flow coordinator at [place] looking into it, and the pt was on hold when he hung up.   Please call pt back and let him know status - [phone]

## 2017-12-11 NOTE — Telephone Encounter (Signed)
Copied from CRM [phone]. Topic: Inquiry >> Dec 11, 2017 12:23 PM Windy KalataMichael, Judd Mccubbin L, NT wrote: Patient is calling and states his disability form was denied and states he needs Dr. Carmelia RollerWendling nurse to contact him today. When he was seen on 11/25/17 there was no clinical notes.

## 2017-12-12 ENCOUNTER — Encounter: Payer: Self-pay | Admitting: Family Medicine

## 2017-12-12 ENCOUNTER — Telehealth: Payer: Self-pay | Admitting: Family Medicine

## 2017-12-12 ENCOUNTER — Ambulatory Visit (INDEPENDENT_AMBULATORY_CARE_PROVIDER_SITE_OTHER): Payer: 59 | Admitting: Family Medicine

## 2017-12-12 VITALS — BP 120/80 | HR 70 | Temp 98.3°F | Ht 64.0 in | Wt 190.0 lb

## 2017-12-12 DIAGNOSIS — K219 Gastro-esophageal reflux disease without esophagitis: Secondary | ICD-10-CM | POA: Diagnosis not present

## 2017-12-12 DIAGNOSIS — J4520 Mild intermittent asthma, uncomplicated: Secondary | ICD-10-CM

## 2017-12-12 MED ORDER — ALBUTEROL SULFATE 108 (90 BASE) MCG/ACT IN AEPB
1.0000 | INHALATION_SPRAY | Freq: Four times a day (QID) | RESPIRATORY_TRACT | 1 refills | Status: DC | PRN
Start: 2017-12-12 — End: 2018-12-12

## 2017-12-12 MED ORDER — OMEPRAZOLE 20 MG PO CPDR: 20.0000 mg | DELAYED_RELEASE_CAPSULE | Freq: Two times a day (BID) | ORAL | 1 refills | Status: DC

## 2017-12-12 MED ORDER — ALBUTEROL SULFATE 108 (90 BASE) MCG/ACT IN AEPB: 1.0000 | INHALATION_SPRAY | Freq: Four times a day (QID) | RESPIRATORY_TRACT | 1 refills | Status: DC | PRN

## 2017-12-12 NOTE — Progress Notes (Signed)
Chief Complaint  Patient presents with  . Follow-up    paperwork    Subjective: Patient is a 37 y.o. male here for f/u paperwork.  The patient is here to follow-up on paperwork.  He initially was diagnosed with situational anxiety by this office.  Medication was offered as well as counseling.  He opted for the latter.  The counselor who he is started seeing is not filled out paperwork for him yet.  He is in the process of contacting that office.  Patient is expressed for over a year now, he has been experiencing intermittent shortness of breath and chest tightness when he is exposed to cold air.  He denies any personal history of allergies or asthma.  He has never tried anything other than avoiding the cold air.  He will sometimes cough.  No other upper respiratory symptoms.  Also for around a year, he has been having burning in his chest.  He notices this most when he lays down.  He is tried taking Tums which is helpful.  He has not tried any other medicines.  This happens intermittently.  He has not noticed any foods contributing to these symptoms.  ROS: GI: no current pain Lungs: Denies current SOB   Family History  Problem Relation Age of Onset  . Hypertension Mother   . Diabetes Father    Past Medical History:  Diagnosis Date  . Kidney stones    No Known Allergies  Current Outpatient Medications:  .  diclofenac (VOLTAREN) 75 MG EC tablet, Take 1 tablet (75 mg total) by mouth 2 (two) times daily., Disp: 60 tablet, Rfl: 1 .  Albuterol Sulfate 108 (90 Base) MCG/ACT AEPB, Inhale 1-2 puffs into the lungs every 6 (six) hours as needed (wheezing/shortness of breath)., Disp: 1 each, Rfl: 1 .  omeprazole (PRILOSEC) 20 MG capsule, Take 1 capsule (20 mg total) by mouth 2 (two) times daily before a meal., Disp: 60 capsule, Rfl: 1     Objective: BP 120/80 (BP Location: Left Arm, Patient Position: Sitting, Cuff Size: Normal)   Pulse 70   Temp 98.3 F (36.8 C) (Oral)   Ht 5\' 4"  (1.626  m)   Wt 190 lb (86.2 kg)   SpO2 97%   BMI 32.61 kg/m  General: Awake, appears stated age HEENT: MMM, EOMi Heart: RRR, no bruits, no LE edema Lungs: CTAB, no rales, wheezes or rhonchi. No accessory muscle use Abd: BS+, soft, NT, ND, no masses or organomegaly Psych: Age appropriate judgment and insight, flat affect and nml mood  Assessment and Plan: Gastroesophageal reflux disease, esophagitis presence not specified - Plan: omeprazole (PRILOSEC) 20 MG capsule  Mild intermittent extrinsic asthma without complication - Plan: Albuterol Sulfate 108 (90 Base) MCG/ACT AEPB  Orders as above. He has not brought these issues to my attention before. Will trial 2 mo of PPI for reflux issue.  Albuterol prn for likely reactive airway/extrinsic asthma.  Will review FMLA paperwork, likely needs counseling team's info as he told me none of this last time, which he admits.  F/u as originally scheduled for CPE. The patient voiced understanding and agreement to the plan.  Jilda Rocheicholas Paul [place], DO 12/12/17  8:55 AM

## 2017-12-12 NOTE — Telephone Encounter (Signed)
Provider has [employer] Dis[employer] paperwork requesting copies of OV, Imaging, Labs, Hospital visits. They are requesting, as other Insurance has done this year, proof that documentation of original FMLA/LOA Disability forms to be substantiated with chart documentation/SLS 03/21

## 2017-12-12 NOTE — Telephone Encounter (Signed)
Provider has [employer] Dis[employer] paperwork requesting copies of OV, Imaging, Labs, Hospital visits. They are requesting, as other Insurance has done this year, proof that documentation of original FMLA/LOA Disability forms to be substantiated with chart documentation/SLS 03/21 

## 2017-12-12 NOTE — Progress Notes (Signed)
Pre visit review using our clinic review tool, if applicable. No additional management support is needed unless otherwise documented below in the visit note. 

## 2017-12-12 NOTE — Patient Instructions (Addendum)
Use the inhaler before you know you are going to be exposed to cold air or if you get short of breath.  Use the reflux medication daily for the next 2 mo.   I think the best thing for your case is to get paperwork from [employer]. We will send over your office visit from today detailing what you told us.  Let us know if you need anything.

## 2017-12-12 NOTE — Telephone Encounter (Signed)
Copied from CRM [id number]. Topic: Quick Communication - See Telephone Encounter >> Dec 12, 2017  5:17 PM Trula SladeWalter, Linda F wrote: CRM for notification. See Telephone encounter for: 12/12/17. Patient stated that his preferred [facility], [facility] [address] [place] did not received the following two prescriptions:  1) Albuterol Sulfate 108 (90 Base) MCG/ACT AEPB  2) omeprazole (PRILOSEC) 20 MG capsule

## 2017-12-13 ENCOUNTER — Encounter: Payer: Self-pay | Admitting: Family Medicine

## 2017-12-16 ENCOUNTER — Ambulatory Visit: Payer: 59 | Admitting: Family Medicine

## 2017-12-16 ENCOUNTER — Ambulatory Visit (INDEPENDENT_AMBULATORY_CARE_PROVIDER_SITE_OTHER): Payer: PRIVATE HEALTH INSURANCE | Admitting: Licensed Clinical Social Worker

## 2017-12-16 DIAGNOSIS — F321 Major depressive disorder, single episode, moderate: Secondary | ICD-10-CM | POA: Diagnosis not present

## 2017-12-19 ENCOUNTER — Telehealth: Payer: Self-pay | Admitting: Family Medicine

## 2017-12-19 NOTE — Telephone Encounter (Signed)
Patient dropped off paperwork. Patient also attach a letter for you -sharon**. Paperwork placed in front office tray.

## 2017-12-20 ENCOUNTER — Telehealth: Payer: Self-pay | Admitting: Family Medicine

## 2017-12-20 NOTE — Telephone Encounter (Signed)
Copied from CRM [id number]. Topic: General - Other >> Dec 20, 2017  9:11 AM Cipriano BunkerLambe, Annette S wrote: Reason for CRM:   pt. Is needing a note from doctor to be able to go back to work which includes if any limitations. He is wanting to go back to work on Monday April 1st.    Please call him and let know when ready to pick up. Would like to pick up today if possible.  FMLA is from Jan. 23  through today march 29th    His insurance denied claim and he is having appeal, but needs income.  His health is the same.

## 2017-12-20 NOTE — Telephone Encounter (Signed)
I have called the number below and the rep I spoke stated they did not need anything from his PCP, but they are in contact with his therapist.

## 2017-12-20 NOTE — Telephone Encounter (Signed)
He is asking us to make a phone call before writing a letter

## 2017-12-20 NOTE — Telephone Encounter (Signed)
Can you call and see what they need? I might have time before the end of the day. TY.

## 2017-12-20 NOTE — Telephone Encounter (Signed)
Caller Name: Harlow MaresJonathan Coke Phone: [phone]  Pt called stating that his insurance claim denied and would like us to contact the appeals specialist before writing letter as mentioned below.   Appeals Specialist: Marcelina MorelJeanine Thiasher Ph# [phone] (customer service & ask for Yehuda MaoJeanine) Emp ID [id number] Case# [id number]  Pt has had no pay for 3 weeks and asking if someone can call today please.

## 2017-12-20 NOTE — Telephone Encounter (Signed)
OK to write letter. TY.

## 2017-12-27 ENCOUNTER — Ambulatory Visit (HOSPITAL_BASED_OUTPATIENT_CLINIC_OR_DEPARTMENT_OTHER)
Admission: RE | Admit: 2017-12-27 | Discharge: 2017-12-27 | Disposition: A | Discharge status: Home / Self Care | Payer: 59 | Source: Ambulatory Visit | Attending: Family Medicine | Admitting: Family Medicine

## 2017-12-27 ENCOUNTER — Encounter: Payer: Self-pay | Admitting: Family Medicine

## 2017-12-27 ENCOUNTER — Ambulatory Visit (INDEPENDENT_AMBULATORY_CARE_PROVIDER_SITE_OTHER): Payer: 59 | Admitting: Family Medicine

## 2017-12-27 VITALS — BP 120/84 | HR 65 | Temp 98.5°F | Ht 64.0 in | Wt 186.1 lb

## 2017-12-27 DIAGNOSIS — M79662 Pain in left lower leg: Secondary | ICD-10-CM

## 2017-12-27 DIAGNOSIS — Z0000 Encounter for general adult medical examination without abnormal findings: Secondary | ICD-10-CM | POA: Diagnosis not present

## 2017-12-27 LAB — COMPREHENSIVE METABOLIC PANEL
ALK PHOS: 58 U/L (ref 39–117)
ALT: 31 U/L (ref 0–53)
AST: 23 U/L (ref 0–37)
Albumin: 4.3 g/dL (ref 3.5–5.2)
BILIRUBIN TOTAL: 0.7 mg/dL (ref 0.2–1.2)
BUN: 19 mg/dL (ref 6–23)
CO2: 30 mEq/L (ref 19–32)
Calcium: 9.5 mg/dL (ref 8.4–10.5)
Chloride: 103 mEq/L (ref 96–112)
Creatinine, Ser: 1.03 mg/dL (ref 0.40–1.50)
GFR: 103.69 mL/min (ref >60.00)
GLUCOSE: 87 mg/dL (ref 70–99)
Potassium: 3.6 mEq/L (ref 3.5–5.1)
SODIUM: 138 meq/L (ref 135–145)
TOTAL PROTEIN: 7.6 g/dL (ref 6.0–8.3)

## 2017-12-27 LAB — LIPID PANEL
Cholesterol: 208 mg/dL — ABNORMAL HIGH (ref 0–200)
HDL: 42.2 mg/dL (ref >39.00)
LDL Cholesterol: 147 mg/dL — ABNORMAL HIGH (ref 0–99)
NONHDL: 166.25
Total CHOL/HDL Ratio: 5
Triglycerides: 94 mg/dL (ref 0.0–149.0)
VLDL: 18.8 mg/dL (ref 0.0–40.0)
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
## 2017-12-27 NOTE — Progress Notes (Signed)
Pre visit review using our clinic review tool, if applicable. No additional management support is needed unless otherwise documented below in the visit note. 

## 2017-12-27 NOTE — Patient Instructions (Signed)
Keep up the good work with exercising.  Keep the diet clean.  We will let you know the results of your X-ray today with further recommendations.  Give us 2-3 business days to get the results of your labs back. If labs are normal, you will likely receive a letter in the mail unless you have MyChart. This can take longer than 2-3 business days.   Let us know if you need anything.

## 2017-12-30 ENCOUNTER — Ambulatory Visit (INDEPENDENT_AMBULATORY_CARE_PROVIDER_SITE_OTHER): Payer: PRIVATE HEALTH INSURANCE | Admitting: Licensed Clinical Social Worker

## 2017-12-30 DIAGNOSIS — F321 Major depressive disorder, single episode, moderate: Secondary | ICD-10-CM | POA: Diagnosis not present

## 2018-01-02 NOTE — Telephone Encounter (Signed)
Patient checking status of paperwork, will need by 01/09/18 Call back [phone]

## 2018-01-02 NOTE — Telephone Encounter (Signed)
To my knowledge, his work place requires no more info from us, but his therapist/counselor. Was there a new form?

## 2018-01-03 NOTE — Telephone Encounter (Signed)
Received FMLA/STD paperwork from [employer] completed and faxed; copy placed at front per patient request for p/u via telephone conversation/SLS 04/12

## 2018-01-03 NOTE — Telephone Encounter (Signed)
Patient wanting to file for FMLA since his Disability claim was denied; asking to have paperwork from 11/04/17 to last visit sent with new forms; completed as much as possible and forwarded to provider/SLS 04/12

## 2018-01-08 ENCOUNTER — Telehealth: Payer: Self-pay | Admitting: Family Medicine

## 2018-01-08 NOTE — Telephone Encounter (Signed)
Copied from CRM [id number]. Topic: General - Other >> Jan 08, 2018  2:11 PM Maia Pettiesrtiz, Kristie S wrote: Reason for CRM: Dr. Elodia FlorenceBroomes is reviewing a disability claim for the pt and is wanting to notify Dr. Carmelia RollerWendling that he will call tomorrow 01/09/18 at 10:00am for a peer-to-peer evaluation regarding patients claim. If there are further questions regarding the claim Dr. Carmelia RollerWendling can also call Cliff at [phone] ext [phone] between 9:00am and 5:00pm.

## 2018-01-09 NOTE — Telephone Encounter (Signed)
Noted  

## 2018-01-14 ENCOUNTER — Ambulatory Visit: Payer: Self-pay | Admitting: Licensed Clinical Social Worker

## 2018-01-20 ENCOUNTER — Ambulatory Visit: Payer: PRIVATE HEALTH INSURANCE | Admitting: Psychology

## 2018-03-03 ENCOUNTER — Ambulatory Visit: Payer: 59 | Admitting: Family Medicine

## 2018-03-03 DIAGNOSIS — Z0289 Encounter for other administrative examinations: Secondary | ICD-10-CM

## 2018-03-05 ENCOUNTER — Encounter: Payer: Self-pay | Admitting: Family Medicine

## 2018-03-05 ENCOUNTER — Other Ambulatory Visit: Payer: Self-pay | Admitting: *Deleted

## 2018-03-05 ENCOUNTER — Ambulatory Visit (INDEPENDENT_AMBULATORY_CARE_PROVIDER_SITE_OTHER): Payer: 59 | Admitting: Family Medicine

## 2018-03-05 ENCOUNTER — Other Ambulatory Visit (HOSPITAL_COMMUNITY)
Admission: RE | Admit: 2018-03-05 | Discharge: 2018-03-05 | Disposition: A | Discharge status: Home / Self Care | Payer: 59 | Source: Ambulatory Visit | Attending: Family Medicine | Admitting: Family Medicine

## 2018-03-05 ENCOUNTER — Telehealth: Payer: Self-pay | Admitting: Family Medicine

## 2018-03-05 VITALS — BP 124/84 | HR 64 | Temp 98.5°F | Ht 64.0 in | Wt 186.4 lb

## 2018-03-05 DIAGNOSIS — B9789 Other viral agents as the cause of diseases classified elsewhere: Principal | ICD-10-CM

## 2018-03-05 DIAGNOSIS — Z114 Encounter for screening for human immunodeficiency virus [HIV]: Secondary | ICD-10-CM | POA: Insufficient documentation

## 2018-03-05 DIAGNOSIS — Z113 Encounter for screening for infections with a predominantly sexual mode of transmission: Secondary | ICD-10-CM

## 2018-03-05 DIAGNOSIS — J069 Acute upper respiratory infection, unspecified: Secondary | ICD-10-CM | POA: Diagnosis not present

## 2018-03-05 DIAGNOSIS — Z20828 Contact with and (suspected) exposure to other viral communicable diseases: Secondary | ICD-10-CM

## 2018-03-05 MED ORDER — PSEUDOEPHEDRINE HCL 30 MG PO TABS
30.0000 mg | ORAL_TABLET | Freq: Four times a day (QID) | ORAL | 0 refills | Status: DC | PRN
Start: 2018-03-05 — End: 2018-03-05

## 2018-03-05 MED ORDER — PSEUDOEPHEDRINE HCL 30 MG PO TABS: 30.0000 mg | ORAL_TABLET | Freq: Four times a day (QID) | ORAL | 0 refills | Status: DC | PRN

## 2018-03-05 NOTE — Addendum Note (Signed)
Addended by: Verdie ShireBAYNES, Jhordyn Hoopingarner M on: 03/05/2018 08:27 AM   Modules accepted: Orders

## 2018-03-05 NOTE — Telephone Encounter (Signed)
Copied from CRM [id number]. Topic: Quick Communication - Rx Refill/Question >> Mar 05, 2018 10:38 AM Maia Pettiesrtiz, Kristie S wrote: Medication: RX for sudafed did not transmit - please resend. Pt was seen this morning.

## 2018-03-05 NOTE — Telephone Encounter (Signed)
RX for sudafed did not transmit - please resend. Pt was seen this morning by Dr. Dolores PattyWendling  [facility] [address] [place]  [address]

## 2018-03-05 NOTE — Patient Instructions (Signed)
Continue to push fluids, practice good hand hygiene, and cover your mouth if you cough.  If you start having fevers, shaking or shortness of breath, seek immediate care.  We will be in touch regarding your results. No news is good news.  Let us know if you need anything.

## 2018-03-05 NOTE — Progress Notes (Signed)
Pre visit review using our clinic review tool, if applicable. No additional management support is needed unless otherwise documented below in the visit note. 

## 2018-03-05 NOTE — Telephone Encounter (Signed)
Refill re-sent  

## 2018-03-05 NOTE — Progress Notes (Signed)
Chief Complaint  Patient presents with  . Nasal Congestion  . Exposure to STD    Vernon MaresJonathan Francis here for URI complaints.  Duration: 1 week  Associated symptoms: sinus congestion, rhinorrhea and cough (from drainage) Denies: sinus pain, itchy watery eyes, ear pain, ear drainage, sore throat, wheezing, shortness of breath and current fever Treatment to date: OTC meds  Sick contacts: No   Possibly exposure to herpes. Mother of his son allegedly tested positive for HSV2. Last sexually active with her 2 mo ago. He is not having any s/s's.   ROS:  Const: Denies current fevers HEENT: As noted in HPI Lungs: No SOB  Past Medical History:  Diagnosis Date  . Kidney stones    Family History  Problem Relation Age of Onset  . Hypertension Mother   . Diabetes Father    Allergies as of 03/05/2018   No Known Allergies     Medication List        Accurate as of 03/05/18  8:15 AM. Always use your most recent med list.          Albuterol Sulfate 108 (90 Base) MCG/ACT Aepb Inhale 1-2 puffs into the lungs every 6 (six) hours as needed (wheezing/shortness of breath).   diclofenac 75 MG EC tablet Commonly known as:  VOLTAREN Take 1 tablet (75 mg total) by mouth 2 (two) times daily.   omeprazole 20 MG capsule Commonly known as:  PRILOSEC Take 1 capsule (20 mg total) by mouth 2 (two) times daily before a meal.   pseudoephedrine 30 MG tablet Commonly known as:  SUDAFED Take 1 tablet (30 mg total) by mouth every 6 (six) hours as needed for congestion.       BP 124/84 (BP Location: Left Arm, Patient Position: Sitting, Cuff Size: Normal)   Pulse 64   Temp 98.5 F (36.9 C) (Oral)   Ht 5\' 4"  (1.626 m)   Wt 186 lb 6 oz (84.5 kg)   SpO2 97%   BMI 31.99 kg/m  General: Awake, alert, appears stated age HEENT: AT, [place], ears patent b/l and TM's neg, nares patent w/o discharge, pharynx pink and without exudates, MMM Neck: No masses or asymmetry Heart: RRR Lungs: CTAB, no accessory  muscle use Abd: Soft, NT, ND Psych: Age appropriate judgment and insight, normal mood and affect  Viral URI with cough - Plan: pseudoephedrine (SUDAFED) 30 MG tablet  Routine screening for STI (sexually transmitted infection) - Plan: Urine cytology ancillary only  Exposure to herpes simplex virus (HSV) - Plan: Herpes Simplex Virus 1/2 (IgG), CSF  Screening for HIV (human immunodeficiency virus) - Plan: HIV antibody  Orders as above. Continue to push fluids, practice good hand hygiene, cover mouth when coughing. Given his potential exposure and 2 mo since intercourse, will ck IgG levels despite being asymptomatic. Will ck HIV and urine ancillary as well.  F/u prn. If starting to experience fevers, shaking, or shortness of breath, seek immediate care. Pt voiced understanding and agreement to the plan.  Jilda Rocheicholas Paul [place], DO 03/05/18 8:15 AM

## 2018-03-06 LAB — HSV(HERPES SIMPLEX VRS) I + II AB-IGG
HAV 1 IGG,TYPE SPECIFIC AB: 14.3 index — ABNORMAL HIGH
HSV 2 IGG, TYPE SPEC: 6 {index} — AB

## 2018-03-06 LAB — URINE CYTOLOGY ANCILLARY ONLY
Chlamydia: NEGATIVE
NEISSERIA GONORRHEA: NEGATIVE
TRICH (WINDOWPATH): NEGATIVE

## 2018-03-11 ENCOUNTER — Encounter: Payer: Self-pay | Admitting: Family Medicine

## 2018-03-11 ENCOUNTER — Telehealth: Payer: Self-pay | Admitting: Family Medicine

## 2018-03-11 NOTE — Telephone Encounter (Signed)
Copied from CRM [id number]. Topic: Quick Communication - See Telephone Encounter >> Mar 11, 2018  9:52 AM Cipriano BunkerLambe, Annette S wrote: CRM for notification. See Telephone encounter for: 03/11/18.  Pt. Calling about lab results from 6/12.  He has not heard anything.

## 2018-03-12 ENCOUNTER — Encounter: Payer: Self-pay | Admitting: Family Medicine

## 2018-03-12 ENCOUNTER — Ambulatory Visit: Payer: Self-pay

## 2018-03-12 NOTE — Telephone Encounter (Signed)
Why is the HIV testing still pending? I had sent the pt a MyChart message stating we are waiting on one more result.

## 2018-03-12 NOTE — Telephone Encounter (Signed)
Incoming call from Client  Requesting  Lab results drawn from 03/05/18. Related to client that some of the labs were still in process and once Dr. Carmelia RollerWendling reviewed the labs he would be in contact.  Related it is a protocol we have to follow. Attempt to  to assure patient that I would contact  Flow coordinator and relate that he was anxious about results. Patient voiced understanding.  Contacted the flow coordinator, related that patient was anxious and requested a phone call from Dr.  Carmelia RollerWendling.  Flow Coordinator Rosann Auerbach( Trish) stated she would relate message to Dr.  Carmelia RollerWendling.

## 2018-03-12 NOTE — Telephone Encounter (Signed)
Would you mind checking on this delay

## 2018-03-12 NOTE — Telephone Encounter (Signed)
Dr Carmelia RollerWendling -- pt called, very anxious to get his STD results. He can see some of them and is very anxious. We told pt what you told him in the email yesterday that we are still waiting on some results to come through and we will be in touch with him. Please advise or send pt an email to help calm his concerns? I called [facility] and they say they did not receive order for HIV and are not able to add the test because it has to have it's own separate tube.  Please advise?

## 2018-03-18 NOTE — Telephone Encounter (Signed)
Called and spoke with the patient and informed him that I called and spoke with a representative with [facility] and was informed that they received the tube of blood, but the HIV that was ordered was not ran.  I asked the patient if he needed this test, and if he would like to come back and have it redrawn.  Patient stated that the blood test that he needed was ran and he really don't need the HIV.  Asked the patient if he has every been tested for HIV, and he stated that he was tested last year.  I informed him that I will inform Dr. Carmelia RollerWendling of this information and see what he would like for you to do.  Patient verbalized understanding and agreed.//AB/CMA

## 2018-03-21 ENCOUNTER — Encounter: Payer: Self-pay | Admitting: Family Medicine

## 2018-04-21 ENCOUNTER — Encounter: Payer: Self-pay | Admitting: Family Medicine

## 2018-04-23 ENCOUNTER — Encounter: Payer: Self-pay | Admitting: Family Medicine

## 2018-04-23 ENCOUNTER — Ambulatory Visit (INDEPENDENT_AMBULATORY_CARE_PROVIDER_SITE_OTHER): Payer: 59 | Admitting: Family Medicine

## 2018-04-23 VITALS — BP 134/82 | HR 62 | Temp 98.3°F | Ht 64.0 in | Wt 179.5 lb

## 2018-04-23 DIAGNOSIS — L910 Hypertrophic scar: Secondary | ICD-10-CM

## 2018-04-23 NOTE — Progress Notes (Signed)
Chief Complaint  Patient presents with  . Follow-up    bumps on private area    Vernon MaresJonathan Francis is a 37 y.o. male here for a skin complaint.  Duration: 3 weeks Location: genital region Pruritic? Yes Painful? No Drainage? No New soaps/lotions/topicals/detergents? Yes Sick contacts? No Other associated symptoms: No Therapies tried thus far: none  ROS:  Const: No fevers Skin: As noted in HPI  Past Medical History:  Diagnosis Date  . HSV-1 (herpes simplex virus 1) infection   . HSV-2 infection   . Kidney stones    BP 134/82 (BP Location: Left Arm, Patient Position: Sitting, Cuff Size: Normal)   Pulse 62   Temp 98.3 F (36.8 C) (Oral)   Ht 5\' 4"  (1.626 m)   Wt 179 lb 8 oz (81.4 kg)   SpO2 96%   BMI 30.81 kg/m  Gen: awake, alert, appearing stated age Lungs: No accessory muscle use Skin: Thickened and raised skin over R side of mons pubis. No drainage, erythema, TTP, fluctuance, excoriation, fluid filled lesions Psych: Age appropriate judgment and insight  Keloid  Offered tx vs referral to derm vs watchful waiting. Will watch.  F/u prn. The patient voiced understanding and agreement to the plan.  Jilda Rocheicholas Paul Sugar GroveWendling, DO 04/23/18 3:21 PM

## 2018-04-23 NOTE — Progress Notes (Signed)
Pre visit review using our clinic review tool, if applicable. No additional management support is needed unless otherwise documented below in the visit note. 

## 2018-04-23 NOTE — Patient Instructions (Addendum)
Let us know if you would like treatment for this area. We can also send you to a dermatologist. It will not give you issues in the future.   Let us know if you need anything.

## 2018-05-22 ENCOUNTER — Ambulatory Visit (HOSPITAL_BASED_OUTPATIENT_CLINIC_OR_DEPARTMENT_OTHER)
Admission: RE | Admit: 2018-05-22 | Discharge: 2018-05-22 | Disposition: A | Discharge status: Home / Self Care | Payer: 59 | Source: Ambulatory Visit | Attending: Family Medicine | Admitting: Family Medicine

## 2018-05-22 ENCOUNTER — Encounter: Payer: Self-pay | Admitting: Family Medicine

## 2018-05-22 ENCOUNTER — Ambulatory Visit (INDEPENDENT_AMBULATORY_CARE_PROVIDER_SITE_OTHER): Payer: 59 | Admitting: Family Medicine

## 2018-05-22 VITALS — BP 118/72 | HR 65 | Temp 97.5°F | Ht 64.0 in | Wt 181.4 lb

## 2018-05-22 DIAGNOSIS — M79672 Pain in left foot: Secondary | ICD-10-CM

## 2018-05-22 NOTE — Progress Notes (Signed)
Pre visit review using our clinic review tool, if applicable. No additional management support is needed unless otherwise documented below in the visit note. 

## 2018-05-22 NOTE — Progress Notes (Signed)
Musculoskeletal Exam  Patient: Vernon Francis DOB: 09/26/1980  DOS: 05/22/2018  SUBJECTIVE:  Chief Complaint:   Chief Complaint  Patient presents with  . Foot Pain    left foot    Vernon Francis is a 37 y.o.  male for evaluation and treatment of L foot pain.   Onset:  2 weeks ago.  No injury or change in activity. Location: Left lateral foot Character:  sharp and throbbing  Progression of issue:  is unchanged Associated symptoms: Some darkening Treatment: to date has been rest.   Neurovascular symptoms: no  ROS: Musculoskeletal/Extremities: +foot pain  Past Medical History:  Diagnosis Date  . HSV-1 (herpes simplex virus 1) infection   . HSV-2 infection   . Kidney stones     Objective: VITAL SIGNS: BP 118/72 (BP Location: Left Arm, Patient Position: Sitting, Cuff Size: Normal)   Pulse 65   Temp (!) 97.5 F (36.4 C) (Oral)   Ht 5\' 4"  (1.626 m)   Wt 181 lb 6 oz (82.3 kg)   SpO2 94%   BMI 31.13 kg/m  Constitutional: Well formed, well developed. No acute distress. Cardiovascular: Brisk cap refill Thorax & Lungs: No accessory muscle use Musculoskeletal: L foot   Over the lateral aspect of the left foot near the plantar surface, there is a small papule with ecchymosis.  It is tender to palpation.  There is no drainage, fluctuance, excessive warmth, or erythema. Neurologic: Normal sensory function. No focal deficits noted.  Psychiatric: Normal mood. Age appropriate judgment and insight. Alert & oriented x 3.    Assessment:  Left foot pain - Plan: DG Foot Complete Left  Plan: X-rays unofficially negative.  Await final read. Recommend donut bandage, ice, anti-inflammatory, Tylenol. F/u prn. The patient voiced understanding and agreement to the plan.   Jilda Rocheicholas Paul PenuelasWendling, DO 05/22/18  3:26 PM

## 2018-05-22 NOTE — Patient Instructions (Signed)
Ice/cold pack over area for 10-15 min twice daily.  OK to take Tylenol 1000 mg (2 extra strength tabs) or 975 mg (3 regular strength tabs) every 6 hours as needed.  Ibuprofen 400-600 mg (2-3 over the counter strength tabs) every 6 hours as needed for pain.  Consider the donut bandages to help offload pressure over this area.  Let us know if you need anything.

## 2018-10-30 ENCOUNTER — Encounter: Payer: Self-pay | Admitting: Family Medicine

## 2018-11-28 ENCOUNTER — Encounter: Payer: Self-pay | Admitting: Family Medicine

## 2018-12-11 ENCOUNTER — Encounter: Payer: Self-pay | Admitting: Family Medicine

## 2018-12-12 ENCOUNTER — Other Ambulatory Visit: Payer: Self-pay

## 2018-12-12 ENCOUNTER — Other Ambulatory Visit: Payer: Self-pay | Admitting: Family Medicine

## 2018-12-12 ENCOUNTER — Ambulatory Visit (INDEPENDENT_AMBULATORY_CARE_PROVIDER_SITE_OTHER): Payer: Managed Care, Other (non HMO) | Admitting: Family Medicine

## 2018-12-12 ENCOUNTER — Encounter: Payer: Self-pay | Admitting: Family Medicine

## 2018-12-12 VITALS — BP 102/72 | HR 73 | Temp 99.0°F | Ht 64.0 in | Wt 185.2 lb

## 2018-12-12 DIAGNOSIS — E7849 Other hyperlipidemia: Secondary | ICD-10-CM

## 2018-12-12 DIAGNOSIS — Z0000 Encounter for general adult medical examination without abnormal findings: Secondary | ICD-10-CM | POA: Diagnosis not present

## 2018-12-12 LAB — LIPID PANEL
CHOLESTEROL: 228 mg/dL — AB (ref 0–200)
HDL: 43 mg/dL (ref >39.00)
LDL Cholesterol: 163 mg/dL — ABNORMAL HIGH (ref 0–99)
NONHDL: 185.09
Total CHOL/HDL Ratio: 5
Triglycerides: 112 mg/dL (ref 0.0–149.0)
VLDL: 22.4 mg/dL (ref 0.0–40.0)

## 2018-12-12 LAB — COMPREHENSIVE METABOLIC PANEL
ALBUMIN: 4.6 g/dL (ref 3.5–5.2)
ALK PHOS: 66 U/L (ref 39–117)
ALT: 31 U/L (ref 0–53)
AST: 20 U/L (ref 0–37)
BUN: 16 mg/dL (ref 6–23)
CO2: 28 mEq/L (ref 19–32)
CREATININE: 1.17 mg/dL (ref 0.40–1.50)
Calcium: 9.9 mg/dL (ref 8.4–10.5)
Chloride: 102 mEq/L (ref 96–112)
GFR: 83.79 mL/min (ref >60.00)
GLUCOSE: 98 mg/dL (ref 70–99)
Potassium: 3.9 mEq/L (ref 3.5–5.1)
SODIUM: 139 meq/L (ref 135–145)
TOTAL PROTEIN: 7.7 g/dL (ref 6.0–8.3)
Total Bilirubin: 0.6 mg/dL (ref 0.2–1.2)

## 2018-12-12 NOTE — Progress Notes (Signed)
Chief Complaint  Patient presents with  . Follow-up    Well Male Vernon Francis is here for a complete physical.   His last physical was <1 year ago.  Current diet: in general, a fair diet.   Current exercise: running Weight trend: stable Daytime fatigue? No. Seat belt? Yes.    Health maintenance Tetanus- Yes HIV- Yes  Past Medical History:  Diagnosis Date  . HSV-1 (herpes simplex virus 1) infection   . HSV-2 infection   . Kidney stones      Past Surgical History:  Procedure Laterality Date  . NO PAST SURGERIES      Medications  Takes no meds routinely.  Allergies No Known Allergies  Family History Family History  Problem Relation Age of Onset  . Hypertension Mother   . Diabetes Father     Review of Systems: Constitutional: no fevers or chills Eye:  no recent significant change in vision Ear/Nose/Mouth/Throat:  Ears:  no tinnitus or hearing loss Nose/Mouth/Throat:  no complaints of nasal congestion, no sore throat Cardiovascular:  no chest pain, no palpitations Respiratory:  no cough and no shortness of breath Gastrointestinal:  no abdominal pain, no change in bowel habits GU:  Male: negative for dysuria, frequency, and incontinence and negative for prostate symptoms Musculoskeletal/Extremities:  no pain, redness, or swelling of the joints Integumentary (Skin/Breast):  no abnormal skin lesions reported Neurologic:  no headaches, no numbness, tingling Endocrine: No unexpected weight changes Hematologic/Lymphatic:  no night sweats  Exam BP 102/72 (BP Location: Left Arm, Patient Position: Sitting, Cuff Size: Normal)   Pulse 73   Temp 99 F (37.2 C) (Oral)   Ht 5\' 4"  (1.626 m)   Wt 185 lb 4 oz (84 kg)   SpO2 97%   BMI 31.80 kg/m  General:  well developed, well nourished, in no apparent distress Skin:  no significant moles, warts, or growths Head:  no masses, lesions, or tenderness Eyes:  pupils equal and round, sclera anicteric without  injection Ears:  canals without lesions, TMs shiny without retraction, no obvious effusion, no erythema Nose:  nares patent, septum midline, mucosa normal Throat/Pharynx:  lips and gingiva without lesion; tongue and uvula midline; non-inflamed pharynx; no exudates or postnasal drainage Neck: neck supple without adenopathy, thyromegaly, or masses Lungs:  clear to auscultation, breath sounds equal bilaterally, no respiratory distress Cardio:  regular rate and rhythm, no bruits, no LE edema Abdomen:  abdomen soft, nontender; bowel sounds normal; no masses or organomegaly Genital (male): circumcised penis, no lesions or discharge; testes present bilaterally without masses or tenderness Rectal: Deferred Musculoskeletal:  symmetrical muscle groups noted without atrophy or deformity Extremities:  no clubbing, cyanosis, or edema, no deformities, no skin discoloration Neuro:  gait normal; deep tendon reflexes normal and symmetric Psych: well oriented with normal range of affect and appropriate judgment/insight  Assessment and Plan  Well adult exam - Plan: Comprehensive metabolic panel, Lipid panel   Well 38 y.o. male. Counseled on diet and exercise. Other orders as above. Follow up in 1 year pending the above workup. The patient voiced understanding and agreement to the plan.  Jilda Roche Meckling, DO 12/12/18 9:39 AM

## 2018-12-12 NOTE — Patient Instructions (Addendum)
Give us 2-3 business days to get the results of your labs back.   Keep the diet clean and stay active.  Do monthly self testicular checks in the shower. You are feeling for lumps/bumps that don't belong. If you feel anything like this, let me know!  Let us know if you need anything. 

## 2018-12-16 ENCOUNTER — Encounter: Payer: Self-pay | Admitting: Family Medicine

## 2018-12-16 ENCOUNTER — Telehealth: Payer: Self-pay | Admitting: Family Medicine

## 2018-12-16 NOTE — Telephone Encounter (Signed)
Pt dropped off a Patient request for Access to have medical records released. Faxed to Medical records, copy scanned into patients chart. Original placed in provider Tray.

## 2018-12-17 ENCOUNTER — Encounter: Payer: Self-pay | Admitting: Family Medicine

## 2018-12-29 ENCOUNTER — Other Ambulatory Visit: Payer: Self-pay

## 2018-12-29 ENCOUNTER — Other Ambulatory Visit (INDEPENDENT_AMBULATORY_CARE_PROVIDER_SITE_OTHER): Payer: Managed Care, Other (non HMO)

## 2018-12-29 DIAGNOSIS — E7849 Other hyperlipidemia: Secondary | ICD-10-CM | POA: Diagnosis not present

## 2018-12-29 LAB — LIPID PANEL
Cholesterol: 240 mg/dL — ABNORMAL HIGH (ref 0–200)
HDL: 38.3 mg/dL — ABNORMAL LOW (ref >39.00)
LDL Cholesterol: 172 mg/dL — ABNORMAL HIGH (ref 0–99)
NonHDL: 202.12
Total CHOL/HDL Ratio: 6
Triglycerides: 149 mg/dL (ref 0.0–149.0)
VLDL: 29.8 mg/dL (ref 0.0–40.0)

## 2019-04-02 ENCOUNTER — Encounter: Payer: Self-pay | Admitting: Family Medicine

## 2019-04-03 ENCOUNTER — Telehealth: Payer: BC Managed Care – PPO | Admitting: Family

## 2019-04-03 ENCOUNTER — Other Ambulatory Visit: Payer: Self-pay

## 2019-04-03 ENCOUNTER — Ambulatory Visit: Payer: BC Managed Care – PPO | Admitting: Internal Medicine

## 2019-04-03 DIAGNOSIS — R300 Dysuria: Secondary | ICD-10-CM | POA: Diagnosis not present

## 2019-04-03 DIAGNOSIS — R350 Frequency of micturition: Secondary | ICD-10-CM | POA: Diagnosis not present

## 2019-04-03 DIAGNOSIS — A64 Unspecified sexually transmitted disease: Secondary | ICD-10-CM | POA: Diagnosis not present

## 2019-04-03 DIAGNOSIS — Z0289 Encounter for other administrative examinations: Secondary | ICD-10-CM

## 2019-04-03 NOTE — Telephone Encounter (Signed)
Appt scheduled

## 2019-04-03 NOTE — Progress Notes (Signed)
Based on what you shared with me, I feel your condition warrants further evaluation and I recommend that you be seen for a face to face office visit.  Male bladder infections are not very common.  We worry about prostate or kidney conditions.  The standard of care is to examine the abdomen and kidneys, and to do a urine and blood test to make sure that something more serious is not going on.  We recommend that you see a provider today.  If your doctor's office is closed [facility] has the following Urgent Cares:    NOTE: If you entered your credit card information for this eVisit, you will not be charged. You may see a "hold" on your card for the $35 but that hold will drop off and you will not have a charge processed.   If you are having a true medical emergency please call 911.     For an urgent face to face visit, [facility] has five urgent care centers for your convenience:     [email or web address] to reserve your spot online an avoid wait times  [facility] [address], [address] [place], [place] [address] Modified hours of operation: Monday-Friday, 12 PM to 6 PM  Closed Saturday & Sunday  *Across the street from [employer]  [facility] (New Address!) [address], [address] [place], [place] [address] *Just off [address], across the road from [employer]* Modified hours of operation: Monday-Friday, 12 PM to 6 PM  Closed Saturday & Sunday   The following sites will take your insurance:  . [facility]    [phone]                  Get Driving Directions  [address] [place], [place] [address] . 10 am to 8 pm Monday-Friday . 12 pm to 8 pm Saturday-Sunday   . [facility]  [phone]                  Get Driving Directions  [address], [address] [place], [place] [address] . 8 am to 8 pm Monday-Friday . 9 am to 6 pm Saturday . 11 am to 6 pm Sunday    . [facility] [place]  [phone]                  Get Driving Directions   [address] [place], [place] [address] . 8 am to 8 pm Monday-Friday . 8 am to 4 pm Saturday-Sunday    . [facility]                    Get Driving Directions  [phone]  [address] [place], [place] [address]  . Monday-Friday, 12 PM to 6 PM    Your e-visit answers were reviewed by a board certified advanced clinical practitioner to complete your personal care plan.  Thank you for using e-Visits. 

## 2019-04-12 IMAGING — CR DG FOOT COMPLETE 3+V*L*
3 series · 3 of 3 positions shown · non-contrast
Comparison: 12/27/2017.

CLINICAL DATA: Pain.

EXAM:
LEFT FOOT - COMPLETE 3+ VIEW

[t foot ap left]
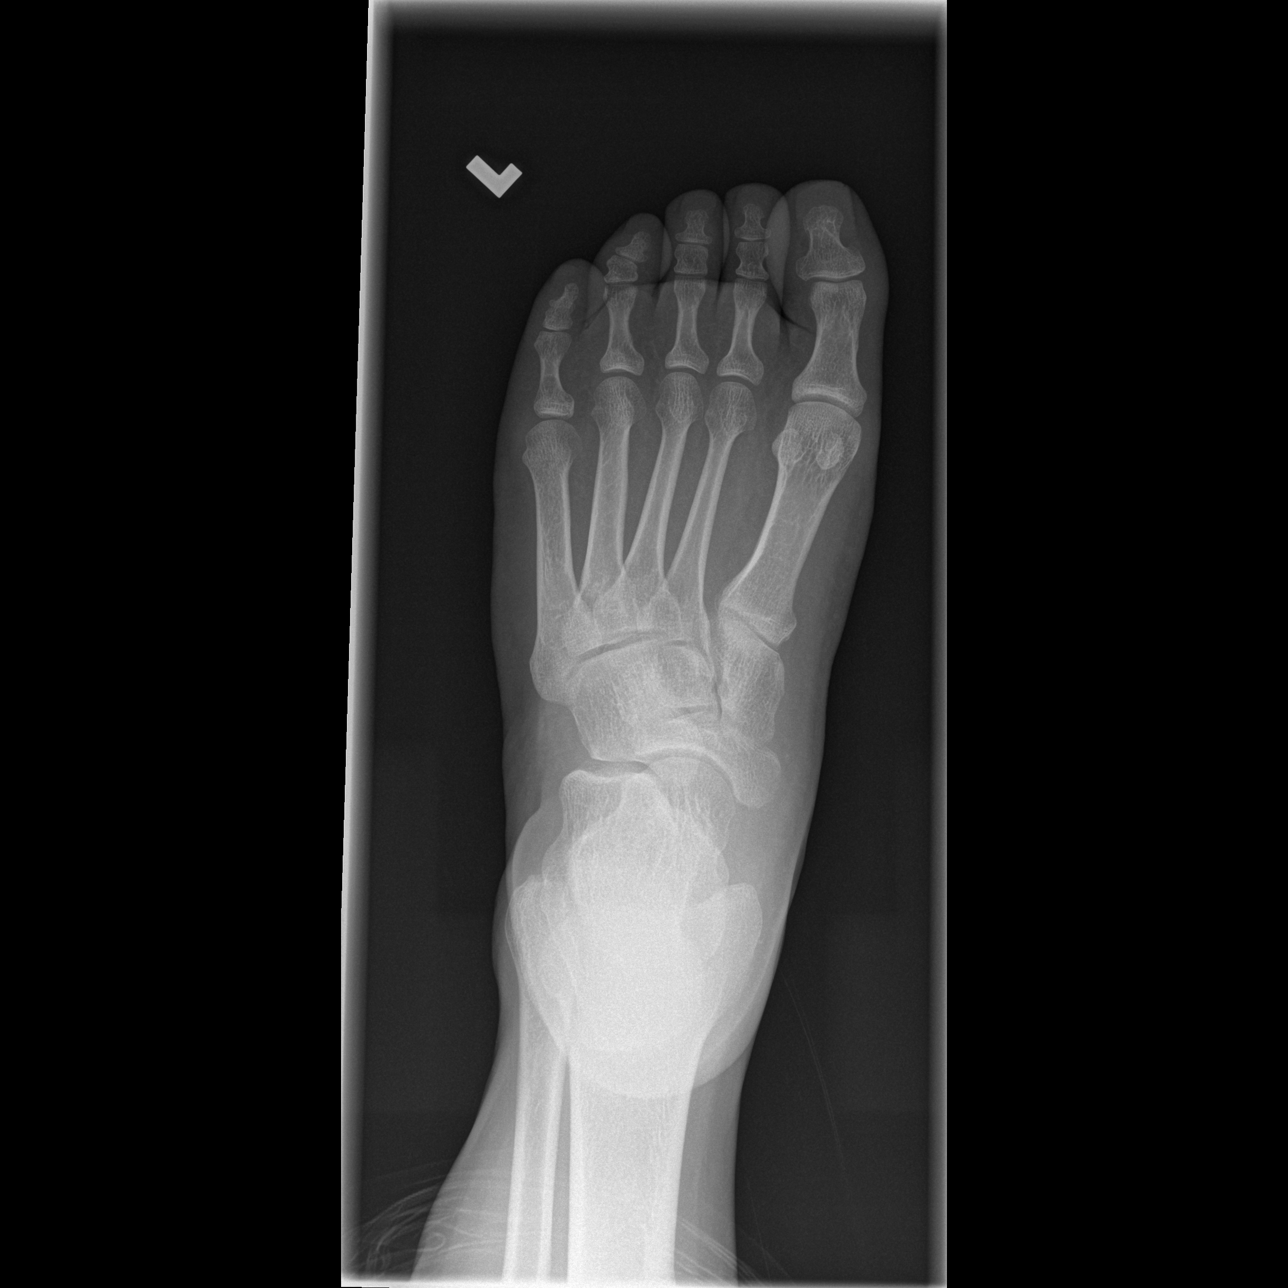

[t foot oblique left]
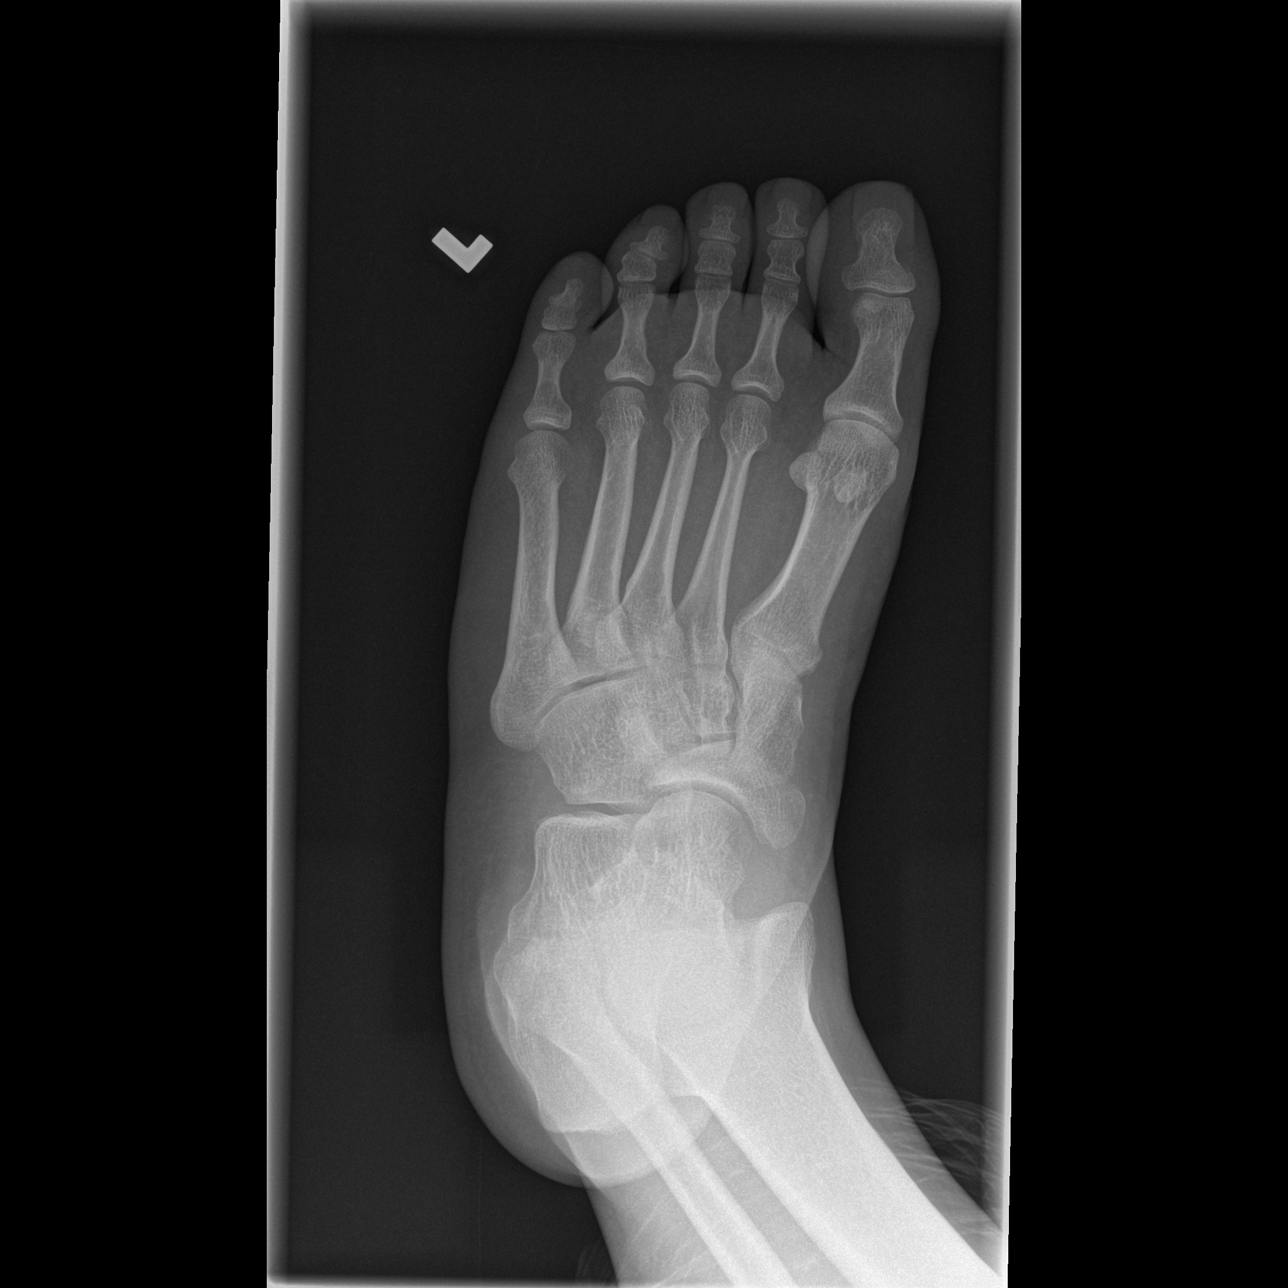

[t foot lat left]
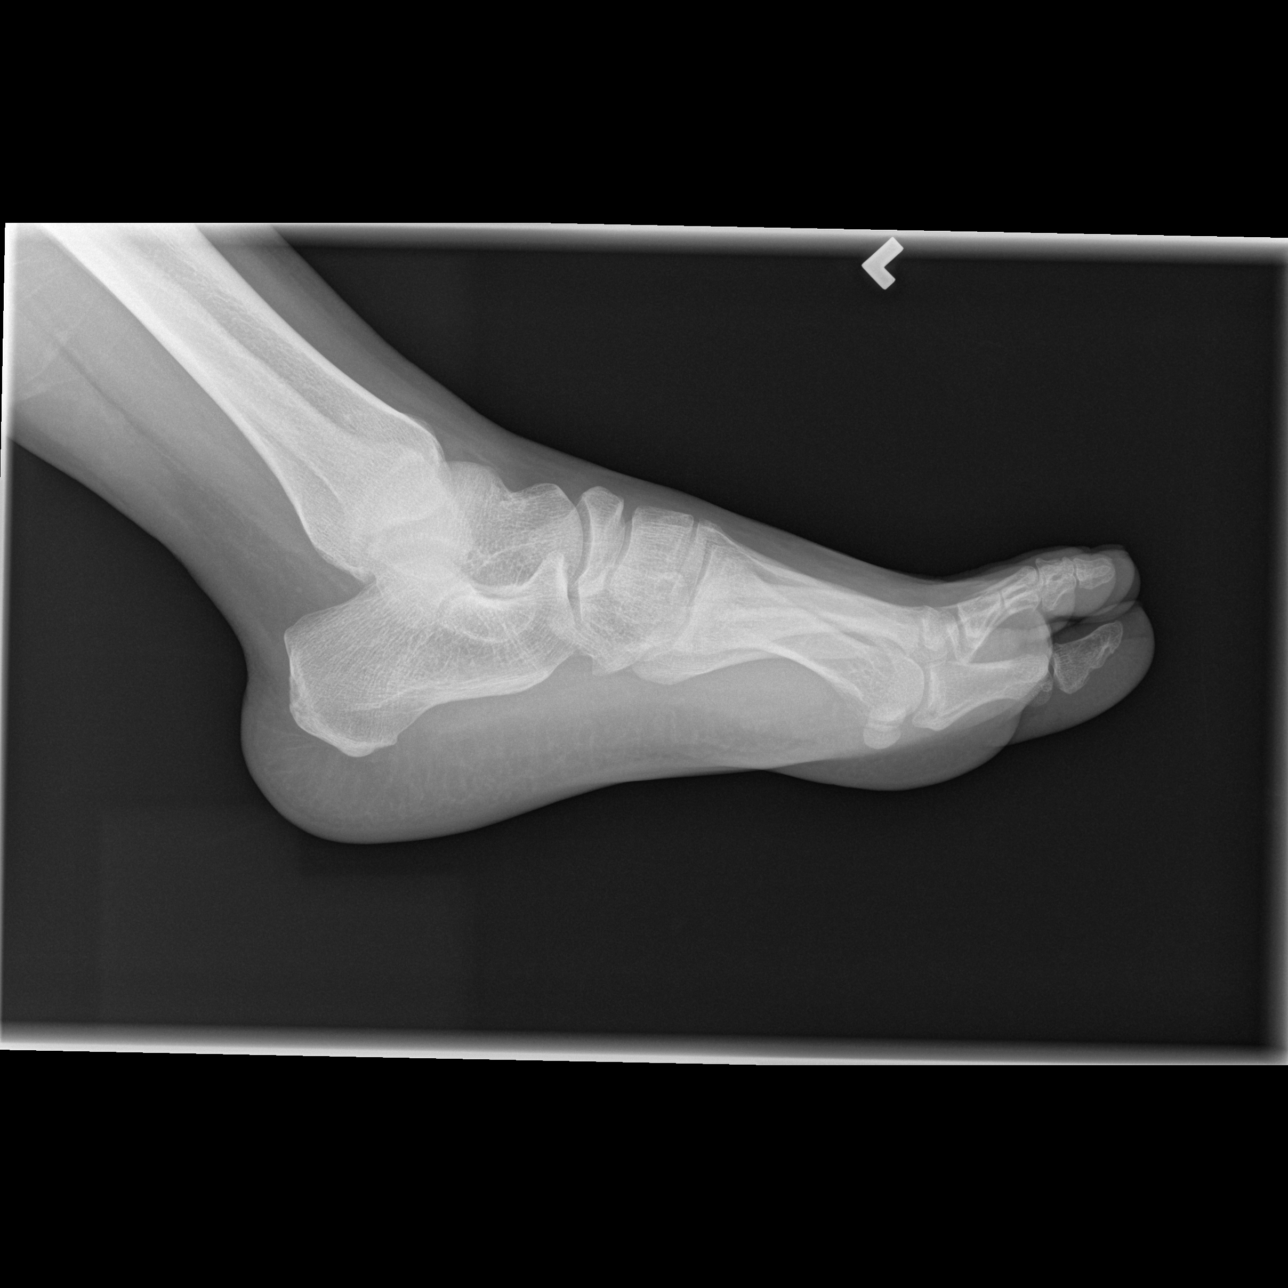

[3 of 3 positions shown; findings below may reference images not displayed]

FINDINGS: No acute bony or joint abnormality identified. No evidence of
fracture or dislocation.
IMPRESSION: No acute abnormality.

## 2019-04-18 ENCOUNTER — Encounter: Payer: Self-pay | Admitting: Family Medicine

## 2019-05-23 ENCOUNTER — Encounter: Payer: Self-pay | Admitting: Family Medicine

## 2019-05-25 ENCOUNTER — Other Ambulatory Visit: Payer: Self-pay

## 2019-05-26 ENCOUNTER — Ambulatory Visit: Payer: BC Managed Care – PPO | Admitting: Family Medicine

## 2019-05-26 ENCOUNTER — Encounter: Payer: Self-pay | Admitting: Family Medicine

## 2019-05-26 VITALS — BP 120/72 | HR 68 | Temp 97.7°F | Ht 64.0 in | Wt 186.5 lb

## 2019-05-26 DIAGNOSIS — K644 Residual hemorrhoidal skin tags: Secondary | ICD-10-CM | POA: Diagnosis not present

## 2019-05-26 MED ORDER — HYDROCORTISONE (PERIANAL) 2.5 % EX CREA: 1.0000 "application " | TOPICAL_CREAM | Freq: Two times a day (BID) | CUTANEOUS | 0 refills | Status: DC

## 2019-05-26 NOTE — Progress Notes (Signed)
Chief Complaint  Patient presents with  . Hemorrhoids    Subjective: Patient is a 38 y.o. male here for hemorrhoids.  Over past 2 weeks, noticed some itching in bottom area and some blood in stool. Has used a bar that helps with BM's and that has helped. Used Prep H that did help with itching. Would like to be sure nothing is going on. No pain, unintentional wt loss, current bleeding, famhx of colon cancer.   ROS: GI: As noted in HPI  Past Medical History:  Diagnosis Date  . HSV-1 (herpes simplex virus 1) infection   . HSV-2 infection   . Kidney stones     Objective: BP 120/72 (BP Location: Left Arm, Patient Position: Sitting, Cuff Size: Normal)   Pulse 68   Temp 97.7 F (36.5 C) (Temporal)   Ht 5\' 4"  (1.626 m)   Wt 186 lb 8 oz (84.6 kg)   SpO2 96%   BMI 32.01 kg/m  General: Awake, appears stated age Abd: BS+, NT, ND Rectal. +ext hemorrhoid ant-central Heart: RRR, no murmurs Lungs: CTAB, no rales, wheezes or rhonchi. No accessory muscle use Psych: Age appropriate judgment and insight, normal affect and mood  Assessment and Plan: External hemorrhoids - Plan: hydrocortisone (ANUSOL-HC) 2.5 % rectal cream  Orders as above. Info given. BM's are more normal. No concern with malignancy. F/u prn.  The patient voiced understanding and agreement to the plan.  [place], DO 05/26/19  9:42 AM

## 2019-05-26 NOTE — Patient Instructions (Signed)

## 2019-06-20 DIAGNOSIS — R000 Tachycardia, unspecified: Secondary | ICD-10-CM | POA: Diagnosis not present

## 2019-06-20 DIAGNOSIS — B9789 Other viral agents as the cause of diseases classified elsewhere: Secondary | ICD-10-CM | POA: Diagnosis not present

## 2019-06-20 DIAGNOSIS — B340 Adenovirus infection, unspecified: Secondary | ICD-10-CM | POA: Diagnosis not present

## 2019-06-20 DIAGNOSIS — R1110 Vomiting, unspecified: Secondary | ICD-10-CM | POA: Diagnosis not present

## 2019-08-25 ENCOUNTER — Ambulatory Visit: Payer: Self-pay | Admitting: *Deleted

## 2019-08-25 ENCOUNTER — Ambulatory Visit (INDEPENDENT_AMBULATORY_CARE_PROVIDER_SITE_OTHER): Payer: BC Managed Care – PPO | Admitting: Family Medicine

## 2019-08-25 ENCOUNTER — Encounter: Payer: Self-pay | Admitting: Family Medicine

## 2019-08-25 ENCOUNTER — Other Ambulatory Visit: Payer: Self-pay

## 2019-08-25 DIAGNOSIS — Z20822 Contact with and (suspected) exposure to covid-19: Secondary | ICD-10-CM

## 2019-08-25 DIAGNOSIS — Z20828 Contact with and (suspected) exposure to other viral communicable diseases: Secondary | ICD-10-CM

## 2019-08-25 NOTE — Telephone Encounter (Signed)
Scheduled evisit  °

## 2019-08-25 NOTE — Telephone Encounter (Signed)
Patient states he was possible exposed to [place]. Patient picked up his son on Thanksgiving and has been notified that his son and the mother have been exposed and are getting tested. They tested yesterday and they do not have symptoms. Patient wants to know if PCP would want him to wait for there results or go ahead and get tested for potential exposure to virus. He is not having symptoms at this time. Advised patient would send question to PCP for recommendation.  Reason for Disposition . [1] CLOSE CONTACT COVID-19 EXPOSURE within last 14 days AND [2] NO symptoms  Answer Assessment - Initial Assessment Questions 1. COVID-19 CLOSE CONTACT: "Who is the person with the confirmed or suspected COVID-19 infection that you were exposed to?"     Contact with person who was exposed to virus 2. PLACE of CONTACT: "Where were you when you were exposed to COVID-19?" (e.g., home, school, medical waiting room; which city?)     At home of person 3. TYPE of CONTACT: "How much contact was there?" (e.g., sitting next to, live in same house, work in same office, same building)     Picking up child- 10 minutes 4. DURATION of CONTACT: "How long were you in contact with the COVID-19 patient?" (e.g., a few seconds, passed by person, a few minutes, 15 minutes or longer, live with the patient)     10 minutes 5. MASK: "Were you wearing a mask?" "Was the other person wearing a mask?" Note: wearing a mask reduces the risk of an  otherwise close contact.     No mask 6. DATE of CONTACT: "When did you have contact with a COVID-19 patient?" (e.g., how many days ago)     11/26 7. COMMUNITY SPREAD: "Are there lots of cases of COVID-19 (community spread) where you live?" (See public health department website, if unsure)       yes 8. SYMPTOMS: "Do you have any symptoms?" (e.g., fever, cough, breathing difficulty, loss of taste or smell)     No symptoms 9. PREGNANCY OR POSTPARTUM: "Is there any chance you are pregnant?" "When  was your last menstrual period?" "Did you deliver in the last 2 weeks?"     n/a 10. HIGH RISK: "Do you have any heart or lung problems? Do you have a weak immune system?" (e.g., heart failure, COPD, asthma, HIV positive, chemotherapy, renal failure, diabetes mellitus, sickle cell anemia, obesity)       no 11.  TRAVEL: "Have you traveled out of the country recently?" If so, "When and where?"  Also ask about out-of-state travel, since the CDC has identified some high-risk cities for community spread in the [place].  Note: Travel becomes less relevant if there is widespread community transmission where the patient lives.       no  Protocols used: CORONAVIRUS (COVID-19) EXPOSURE-A-AH

## 2019-08-25 NOTE — Progress Notes (Signed)
CC: Covid exposure  Subjective: Patient is a 38 y.o. male here for covid exposure. Due to COVID-19 pandemic, we are interacting via web portal for an electronic face-to-face visit. I verified patient's ID using 2 identifiers. Patient agreed to proceed with visit via this method. Patient is at home, I am at office. Patient and I are present for visit.   Pt w possible exposure to covid. Son's mother works in Human resources officer. She does not wear a mask and was styling the hair of a client who called back and tested positive for covid. The client was not wearing a mask either. She is asymptomatic, but did interact w patient and had close contact w patient's son, most recently 5 d ago. His son is also asymptomatic along with the patient. Denies sob, cough, URI s/s's, gi s/s's, aches, or fevers.   ROS: Const: No fevers  Past Medical History:  Diagnosis Date  . HSV-1 (herpes simplex virus 1) infection   . HSV-2 infection   . Kidney stones     Objective: No conversational dyspnea Age appropriate judgment and insight Nml affect and mood  Assessment and Plan: Exposure to COVID-19 virus - Plan: Novel Coronavirus, NAA ([facility])  Will ck for covid. Precautions verbalized. SOB or inability to keep up with fluids should prompt seeking emergent care.  F/u prn.  The patient voiced understanding and agreement to the plan.  [place], DO 08/25/19  2:38 PM

## 2019-08-25 NOTE — Telephone Encounter (Signed)
Evisit

## 2019-11-07 ENCOUNTER — Encounter: Payer: Self-pay | Admitting: Family Medicine

## 2019-11-10 ENCOUNTER — Other Ambulatory Visit: Payer: Self-pay

## 2019-11-11 ENCOUNTER — Ambulatory Visit (INDEPENDENT_AMBULATORY_CARE_PROVIDER_SITE_OTHER): Payer: BC Managed Care – PPO | Admitting: Medical

## 2019-11-11 ENCOUNTER — Ambulatory Visit (HOSPITAL_BASED_OUTPATIENT_CLINIC_OR_DEPARTMENT_OTHER)
Admission: RE | Admit: 2019-11-11 | Discharge: 2019-11-11 | Disposition: A | Discharge status: Home / Self Care | Payer: BC Managed Care – PPO | Source: Ambulatory Visit | Attending: Medical | Admitting: Medical

## 2019-11-11 ENCOUNTER — Other Ambulatory Visit: Payer: Self-pay

## 2019-11-11 VITALS — BP 110/76 | HR 63 | Temp 98.1°F | Resp 12 | Ht 64.0 in | Wt 194.8 lb

## 2019-11-11 DIAGNOSIS — M79672 Pain in left foot: Secondary | ICD-10-CM | POA: Diagnosis not present

## 2019-11-11 DIAGNOSIS — M79671 Pain in right foot: Secondary | ICD-10-CM | POA: Insufficient documentation

## 2019-11-11 NOTE — Patient Instructions (Signed)
For bilateral heel spurs recommend that you get xrays to day to see if you have heel spurs. Use Dr. Jari Sportsman heel pads. Stretch calves in morning.   Can use advil otc for pain.  If pain not improving then can refer to podiatrist since had for months.  Follow up 7-10 days or as needed

## 2019-11-11 NOTE — Progress Notes (Signed)
   Subjective:    Patient ID: Vernon Francis, male    DOB: October 25, 1980, 39 y.o.   MRN: [id number]  HPI  Pt in states he has some bilateral heel pain. Pt states more on rt side and now on both sides. Sometimes pain when he jogs. This has been going on maybe 6 months. Pain in heels when first wakes up.  Using some otc cream to help with pain. He does not think it is voltaren.      Review of Systems     Objective:   Physical Exam  General- No acute distress. Pleasant patient. Neck- Full range of motion, no jvd Lungs- Clear, even and unlabored. Heart- regular rate and rhythm. Neurologic- CNII- XII grossly intact.  Bilateral- heel pain on palpation.        Assessment & Plan:  For bilateral heel spurs recommend that you get xrays to day to see if you have heel spurs. Use Dr. Jari Sportsman heel pads. Stretch calves in morning.   Can use advil otc for pain.  If pain not improving then can refer to podiatrist since had for months.  Follow up 7-10 days or as needed  20 minutes spent with pt today.  Esperanza Richters, PA-C

## 2019-11-19 ENCOUNTER — Ambulatory Visit: Payer: BC Managed Care – PPO | Admitting: Medical

## 2019-11-19 DIAGNOSIS — Z0289 Encounter for other administrative examinations: Secondary | ICD-10-CM

## 2019-11-22 DIAGNOSIS — Z6833 Body mass index (BMI) 33.0-33.9, adult: Secondary | ICD-10-CM | POA: Diagnosis not present

## 2019-11-22 DIAGNOSIS — Z713 Dietary counseling and surveillance: Secondary | ICD-10-CM | POA: Diagnosis not present

## 2019-11-22 DIAGNOSIS — Z13220 Encounter for screening for lipoid disorders: Secondary | ICD-10-CM | POA: Diagnosis not present

## 2019-11-22 DIAGNOSIS — Z136 Encounter for screening for cardiovascular disorders: Secondary | ICD-10-CM | POA: Diagnosis not present

## 2019-12-17 ENCOUNTER — Other Ambulatory Visit: Payer: Self-pay

## 2019-12-18 ENCOUNTER — Encounter: Payer: Self-pay | Admitting: Family Medicine

## 2019-12-18 ENCOUNTER — Other Ambulatory Visit: Payer: Self-pay

## 2019-12-18 ENCOUNTER — Ambulatory Visit (INDEPENDENT_AMBULATORY_CARE_PROVIDER_SITE_OTHER): Payer: BC Managed Care – PPO | Admitting: Family Medicine

## 2019-12-18 VITALS — BP 108/62 | HR 72 | Temp 98.5°F | Ht 64.0 in | Wt 195.2 lb

## 2019-12-18 DIAGNOSIS — Z125 Encounter for screening for malignant neoplasm of prostate: Secondary | ICD-10-CM

## 2019-12-18 DIAGNOSIS — Z0000 Encounter for general adult medical examination without abnormal findings: Secondary | ICD-10-CM | POA: Diagnosis not present

## 2019-12-18 DIAGNOSIS — M722 Plantar fascial fibromatosis: Secondary | ICD-10-CM

## 2019-12-18 LAB — CBC
HCT: 42.5 % (ref 39.0–52.0)
Hemoglobin: 13.8 g/dL (ref 13.0–17.0)
MCHC: 32.5 g/dL (ref 30.0–36.0)
MCV: 87.3 fl (ref 78.0–100.0)
Platelets: 147 10*3/uL — ABNORMAL LOW (ref 150.0–400.0)
RBC: 4.87 Mil/uL (ref 4.22–5.81)
RDW: 14.1 % (ref 11.5–15.5)
WBC: 7 10*3/uL (ref 4.0–10.5)

## 2019-12-18 LAB — COMPREHENSIVE METABOLIC PANEL
ALT: 24 U/L (ref 0–53)
AST: 20 U/L (ref 0–37)
Albumin: 4.4 g/dL (ref 3.5–5.2)
Alkaline Phosphatase: 54 U/L (ref 39–117)
BUN: 21 mg/dL (ref 6–23)
CO2: 29 mEq/L (ref 19–32)
Calcium: 9.1 mg/dL (ref 8.4–10.5)
Chloride: 105 mEq/L (ref 96–112)
Creatinine, Ser: 1.1 mg/dL (ref 0.40–1.50)
GFR: 89.51 mL/min (ref >60.00)
Glucose, Bld: 92 mg/dL (ref 70–99)
Potassium: 4 mEq/L (ref 3.5–5.1)
Sodium: 139 mEq/L (ref 135–145)
Total Bilirubin: 0.7 mg/dL (ref 0.2–1.2)
Total Protein: 7.3 g/dL (ref 6.0–8.3)

## 2019-12-18 LAB — LIPID PANEL
Cholesterol: 194 mg/dL (ref 0–200)
HDL: 34.2 mg/dL — ABNORMAL LOW (ref >39.00)
LDL Cholesterol: 132 mg/dL — ABNORMAL HIGH (ref 0–99)
NonHDL: 159.89
Total CHOL/HDL Ratio: 6
Triglycerides: 140 mg/dL (ref 0.0–149.0)
VLDL: 28 mg/dL (ref 0.0–40.0)

## 2019-12-18 LAB — PSA: PSA: 1.13 ng/mL (ref 0.10–4.00)

## 2019-12-18 NOTE — Patient Instructions (Addendum)
Give [place] 2-3 business days to get the results of your labs back.   Keep the diet clean and stay active.  Let us know if you need anything.  Ice/cold pack over area for 10-15 min twice daily.  Consider a [place] sock to wear at night.  I want to see you in around 1 month if your heels aren't better.    Plantar Fasciitis Stretches/exercises Do exercises exactly as told by your health care provider and adjust them as directed. It is normal to feel mild stretching, pulling, tightness, or discomfort as you do these exercises, but you should stop right away if you feel sudden pain or your pain gets worse.   Stretching and range of motion exercises These exercises warm up your muscles and joints and improve the movement and flexibility of your foot. These exercises also help to relieve pain.  Exercise A: Plantar fascia stretch 1. Sit with your left / right leg crossed over your opposite knee. 2. Hold your heel with one hand with that thumb near your arch. With your other hand, hold your toes and gently pull them back toward the top of your foot. You should feel a stretch on the bottom of your toes or your foot or both. 3. Hold this stretch for 30 seconds. 4. Slowly release your toes and return to the starting position. Repeat 2 times. Complete this exercise 3 times per week.  Exercise B: Gastroc, standing 1. Stand with your hands against a wall. 2. Extend your left / right leg behind you, and bend your front knee slightly. 3. Keeping your heels on the floor and keeping your back knee straight, shift your weight toward the wall without arching your back. You should feel a gentle stretch in your left / right calf. 4. Hold this position for 30 seconds. Repeat 2 times. Complete this exercise 3 times a week. Exercise C: Soleus, standing 1. Stand with your hands against a wall. 2. Extend your left / right leg behind you, and bend your front knee slightly. 3. Keeping your heels on the floor,  bend your back knee and slightly shift your weight over the back leg. You should feel a gentle stretch deep in your calf. 4. Hold this position for 30 seconds. Repeat 2 times. Complete this exercise 3 times per week. Exercise D: Gastrocsoleus, standing 1. Stand with the ball of your left / right foot on a step. The ball of your foot is on the walking surface, right under your toes. 2. Keep your other foot firmly on the same step. 3. Hold onto the wall or a railing for balance. 4. Slowly lift your other foot, allowing your body weight to press your heel down over the edge of the step. You should feel a stretch in your left / right calf. 5. Hold this position for 30 seconds. 6. Return both feet to the step. 7. Repeat this exercise with a slight bend in your left / right knee. Repeat 2 times with your left / right knee straight and 2times with your left / right knee bent. Complete this exercise 3 times a week.  Balance exercise This exercise builds your balance and strength control of your arch to help take pressure off your plantar fascia. Exercise E: Single leg stand 1. Without shoes, stand near a railing or in a doorway. You may hold onto the railing or door frame as needed. 2. Stand on your left / right foot. Keep your big toe down on the  floor and try to keep your arch lifted. Do not let your foot roll inward. 3. Hold this position for 30 seconds. 4. If this exercise is too easy, you can try it with your eyes closed or while standing on a pillow. Repeat 2 times. Complete this exercise 3 times per week. This information is not intended to replace advice given to you by your health care provider. Make sure you discuss any questions you have with your health care provider. Document Released: 09/10/2005 Document Revised: 05/15/2016 Document Reviewed: 07/25/2015 Elsevier Interactive Patient Education  2017 [employer].

## 2019-12-18 NOTE — Progress Notes (Signed)
Chief Complaint  Patient presents with  . Annual Exam    Well Male Vernon Francis is here for a complete physical.   His last physical was >1 year ago.  Current diet: in general, diet has been better   Current exercise: wt resistance exercising Weight trend: up a little Daytime fatigue? No. Seat belt? Yes.    Health maintenance Tetanus- Yes HIV- Yes  Past Medical History:  Diagnosis Date  . HSV-1 (herpes simplex virus 1) infection   . HSV-2 infection   . Kidney stones      Past Surgical History:  Procedure Laterality Date  . NO PAST SURGERIES      Medications  Takes no meds routinely.    Allergies No Known Allergies  Family History Family History  Problem Relation Age of Onset  . Hypertension Mother   . Diabetes Father     Review of Systems: Constitutional: no fevers or chills Eye:  no recent significant change in vision Ear/Nose/Mouth/Throat:  Ears:  no hearing loss Nose/Mouth/Throat:  no complaints of nasal congestion, no sore throat Cardiovascular:  no chest pain Respiratory:  no shortness of breath Gastrointestinal:  no abdominal pain, no change in bowel habits GU:  Male: negative for dysuria, frequency, and incontinence Musculoskeletal/Extremities: +b/l heel pain; otherwise no pain of the joints Integumentary (Skin/Breast):  no abnormal skin lesions reported Neurologic:  no headaches Endocrine: No unexpected weight changes  Exam BP 108/62 (BP Location: Left Arm, Patient Position: Sitting, Cuff Size: Large)   Pulse 72   Temp 98.5 F (36.9 C) (Temporal)   Ht 5\' 4"  (1.626 m)   Wt 195 lb 4 oz (88.6 kg)   SpO2 97%   BMI 33.51 kg/m  General:  well developed, well nourished, in no apparent distress Skin:  no significant moles, warts, or growths Head:  no masses, lesions, or tenderness Eyes:  pupils equal and round, sclera anicteric without injection Ears:  canals without lesions, TMs shiny without retraction, no obvious effusion, no  erythema Nose:  nares patent, septum midline, mucosa normal Throat/Pharynx:  lips and gingiva without lesion; tongue and uvula midline; non-inflamed pharynx; no exudates or postnasal drainage Neck: neck supple without adenopathy, thyromegaly, or masses Lungs:  clear to auscultation, breath sounds equal bilaterally, no respiratory distress Cardio:  regular rate and rhythm, no bruits, no LE edema Abdomen:  abdomen soft, nontender; bowel sounds normal; no masses or organomegaly Rectal: Deferred Musculoskeletal: +TTP over b/l heels at plantar fascia insertion; symmetrical muscle groups noted without atrophy or deformity Extremities:  no clubbing, cyanosis, or edema, no deformities, no skin discoloration Neuro:  gait normal; deep tendon reflexes normal and symmetric Psych: well oriented with normal range of affect and appropriate judgment/insight  Assessment and Plan  Well adult exam - Plan: CBC, Comprehensive metabolic panel, Lipid panel  Screening for prostate cancer - Plan: PSA  Plantar fasciitis, bilateral   Well 39 y.o. male. Counseled on diet and exercise. Counseled on risks and benefits of prostate cancer screening with PSA. The patient agrees to undergo screening.  Other orders as above. PF- Stretches/exercises, ice, [place] sock.  Follow up in 1 year pending the above workup. If his PF is not better, I will see him in 1 mo where we will inject and consider PT.  The patient voiced understanding and agreement to the plan.  41 [name], DO 12/18/19 7:32 AM

## 2020-04-26 ENCOUNTER — Telehealth: Payer: Self-pay

## 2020-04-26 NOTE — Telephone Encounter (Signed)
Caller wants to know if the office gives the yellow fever vaccine. Telephone: [phone]

## 2020-04-26 NOTE — Telephone Encounter (Signed)
Called informed of response///He stated has already scheduled this vaccine

## 2020-04-26 NOTE — Telephone Encounter (Signed)
Yes, or [facility]. Ty.

## 2020-04-26 NOTE — Telephone Encounter (Signed)
Health dept?

## 2020-04-27 DIAGNOSIS — Z7184 Encounter for health counseling related to travel: Secondary | ICD-10-CM | POA: Diagnosis not present

## 2020-04-27 DIAGNOSIS — Z23 Encounter for immunization: Secondary | ICD-10-CM | POA: Diagnosis not present

## 2020-05-06 ENCOUNTER — Other Ambulatory Visit: Payer: Self-pay

## 2020-05-06 ENCOUNTER — Ambulatory Visit: Payer: BC Managed Care – PPO | Admitting: Family Medicine

## 2020-05-06 ENCOUNTER — Encounter: Payer: Self-pay | Admitting: Family Medicine

## 2020-05-06 VITALS — BP 118/82 | HR 82 | Temp 98.2°F | Ht 64.0 in | Wt 193.0 lb

## 2020-05-06 DIAGNOSIS — Z634 Disappearance and death of family member: Secondary | ICD-10-CM

## 2020-05-06 NOTE — Patient Instructions (Addendum)
Please consider counseling. Contact [phone] to schedule an appointment or inquire about cost/insurance coverage.  Cancel appointment if you are doing better.  Continue exercising.   I recommend getting the flu shot in mid October. This suggestion would change if the CDC comes out with a different recommendation.   Coping skills Choose 5 that work for you:  Take a deep breath  Count to 20  Read a book  Do a puzzle  Meditate  Bake  Sing  Knit  Garden  Pray  Go outside  Call a friend  Listen to music  Take a walk  Color  Send a note  Take a bath  Watch a movie  Be alone in a quiet place  Pet an animal  Visit a friend  Journal  Exercise  Stretch   Let us know if you need anything.

## 2020-05-06 NOTE — Progress Notes (Signed)
Chief Complaint  Patient presents with  . Follow-up    mother passed on 05/02/2020    Subjective: Patient is a 39 y.o. male here for depression.  The patient's mother passed away on 05/03/2020.  She was residing in [place] and was in the process of moving to the [place].  She was very close to the end of that process when she passed away.  Since that time, the patient expresses depression, anxiety, difficulty concentrating.  He tried working but it is very difficult.  He works in Interior and spatial designer recently deceased.  Because of his mentation, he does not believe he can work in any department at this time though.  His job offers short-term disability and he is interested in taking advantages for the time being.  He has no homicidal or suicidal ideations, hallucinations, or delusions.  He is not interested in going on medication.  He is open to counseling and is not currently following with one.     Past Medical History:  Diagnosis Date  . HSV-1 (herpes simplex virus 1) infection   . HSV-2 infection   . Kidney stones     Objective: BP 118/82 (BP Location: Left Arm, Patient Position: Sitting, Cuff Size: Normal)   Pulse 82   Temp 98.2 F (36.8 C) (Oral)   Ht 5\' 4"  (1.626 m)   Wt 193 lb (87.5 kg)   SpO2 98%   BMI 33.13 kg/m  General: Awake, appears stated age Lungs: No accessory muscle use Psych: Age appropriate judgment and insight, flat affect and dysthymic mood  Assessment and Plan: Bereavement  Counseling info given. Counseled on exercise. Offered medication, he declined for now. STD form filled out excusing through 10/7, return on 10/8. I think he will be able to return prior to that though.  F/u in 6 weeks.  The patient voiced understanding and agreement to the plan.  12/8 [place], DO 05/06/20  7:53 AM

## 2020-05-09 ENCOUNTER — Telehealth: Payer: Self-pay | Admitting: Family Medicine

## 2020-05-09 NOTE — Telephone Encounter (Signed)
Pt dropped off document to be filled out by provider [facility] Provider Statement 5 pages) Pt would like to be called when ready at [phone]. Document put at front office tray under providers name.

## 2020-05-09 NOTE — Telephone Encounter (Signed)
Pt decided would like document to be faxed to [phone] when ready.

## 2020-05-10 NOTE — Telephone Encounter (Signed)
Has been completed and faxed

## 2020-05-24 NOTE — Telephone Encounter (Signed)
Patient called in on 05/23/2020 to inform that form needs to be more elaborated on why the employee cannot work. PCP did addon to the form. refaxed again to number Received confirmation. Called the patient informed that form faxed.

## 2020-05-31 ENCOUNTER — Other Ambulatory Visit: Payer: Self-pay

## 2020-05-31 ENCOUNTER — Encounter: Payer: Self-pay | Admitting: Family Medicine

## 2020-05-31 ENCOUNTER — Ambulatory Visit (INDEPENDENT_AMBULATORY_CARE_PROVIDER_SITE_OTHER): Payer: BC Managed Care – PPO | Admitting: Family Medicine

## 2020-05-31 VITALS — BP 134/82 | HR 89 | Temp 98.4°F | Ht 64.0 in | Wt 195.5 lb

## 2020-05-31 DIAGNOSIS — Z634 Disappearance and death of family member: Secondary | ICD-10-CM

## 2020-05-31 MED ORDER — SERTRALINE HCL 50 MG PO TABS: 50.0000 mg | ORAL_TABLET | Freq: Every day | ORAL | 3 refills | Status: DC

## 2020-05-31 NOTE — Patient Instructions (Signed)
Please consider counseling. Contact [phone] to schedule an appointment or inquire about cost/insurance coverage.  Aim to do some physical exertion for 150 minutes per week. This is typically divided into 5 days per week, 30 minutes per day. The activity should be enough to get your heart rate up. Anything is better than nothing if you have time constraints.  [facility] [address] [place], [place] [address] [phone]  [facility] [address] [place], [place] [address] [phone]  [facility] [address] [place], [place] [address] [phone]  [facility] [address], [place], [place], #[phone] [address], [place], [place], #[phone]  [facility] [address] [place], [place] 803 [phone]  [facility] [address] [place], [place] [phone]  [facility] [address] [place], [place] [phone]  Call one of these offices sooner than later as it can take 2-3 months to get a new patient appointment.

## 2020-05-31 NOTE — Progress Notes (Addendum)
Chief Complaint  Patient presents with  . Follow-up    paper work    Subjective: Patient is a 39 y.o. male here for f/u bereavement.  Patient was seen around 3 weeks ago after the passing of his mother.  Since that time, he has buried her and after seeing her body things have gotten worse with depression and anxiety.  He elected not to start a medication initially but is interested in doing so now.  An official start date for leave is August 23.  He was initially scheduled to go back on October 8.  He is not following with a counselor or psychologist but is interested in doing so.  He is no homicidal or suicidal ideation.  No self medication.  He has never been on any medication for mood issues in the past.  Past Medical History:  Diagnosis Date  . HSV-1 (herpes simplex virus 1) infection   . HSV-2 infection   . Kidney stones     Objective: BP 134/82 (BP Location: Left Arm, Patient Position: Sitting, Cuff Size: Normal)   Pulse 89   Temp 98.4 F (36.9 C) (Oral)   Ht 5\' 4"  (1.626 m)   Wt 195 lb 8 oz (88.7 kg)   SpO2 97%   BMI 33.56 kg/m  General: Awake, appears stated age Lungs: No accessory muscle use Psych: Age appropriate judgment and insight, normal affect and mood  Assessment and Plan: Bereavement - Plan: sertraline (ZOLOFT) 50 MG tablet  Start Zoloft, 25 mg/d for 2 weeks, then 50 mg/d. Counseling info given. Psych info given. Will complete form again. Plan to return in 5 weeks w me, return to work on 10/18 tentatively.  The patient voiced understanding and agreement to the plan.  11/18 [place], DO 05/31/20  10:28 AM

## 2020-06-02 ENCOUNTER — Telehealth: Payer: Self-pay | Admitting: Family Medicine

## 2020-06-02 NOTE — Telephone Encounter (Signed)
Form placed in Dr.Wendling box

## 2020-06-02 NOTE — Telephone Encounter (Signed)
Pt dropped off document to be filled out by provider ( FMLA - 4 pages [name]) Pt would like document to be faxed when ready at [phone]. Document put at front office tray under providers name.

## 2020-06-03 NOTE — Telephone Encounter (Signed)
Document completed on Wednesday 06/01/2020

## 2020-06-03 NOTE — Telephone Encounter (Signed)
Caller: Dahir Call back # [phone]  Patient states the form he dropped off yesterday 06/02/20 is different from 06/01/20.

## 2020-06-03 NOTE — Telephone Encounter (Signed)
Called the patient to inform we thought it was the same form and put in the shred. He is going to down load it and send it through my chart.

## 2020-06-04 NOTE — Telephone Encounter (Signed)
Please fill out what you can in the new form from the previous form I filled out at his appt. TY.

## 2020-06-06 NOTE — Telephone Encounter (Signed)
Called the patient informed that form is completed/can pickup at the front desk/needs to sign.

## 2020-06-17 ENCOUNTER — Telehealth: Payer: Self-pay | Admitting: Family Medicine

## 2020-06-17 NOTE — Telephone Encounter (Signed)
Caller: Yang  Call Back # [phone]  Patient states his employer has  denied his leave of absence request. Patient states he is depressed and would like to speak with provider in reference to depression and possibly get clinical notes to turn into his employer for approval of leave. patient has been out of work for several weeks without pay. Patient states he is begging to come in office to speak with Dr Carmelia Roller as soon as possible.   Please advise

## 2020-06-18 NOTE — Telephone Encounter (Signed)
Attempted to contact pt and was not able to reach him. Pt does have an appt coming up on 06/28/20

## 2020-06-20 ENCOUNTER — Other Ambulatory Visit: Payer: Self-pay | Admitting: Family Medicine

## 2020-06-20 DIAGNOSIS — F418 Other specified anxiety disorders: Secondary | ICD-10-CM

## 2020-06-20 DIAGNOSIS — Z634 Disappearance and death of family member: Secondary | ICD-10-CM

## 2020-06-20 NOTE — Telephone Encounter (Signed)
There is the [facility] psych urgent care if he needs something immediate, or [facility]. I can see him Friday otherwise. Ty.

## 2020-06-20 NOTE — Telephone Encounter (Signed)
Called the patient informed///he has changed his appt and is coming in tomorrow 06/21/20

## 2020-06-21 ENCOUNTER — Ambulatory Visit: Payer: BC Managed Care – PPO | Admitting: Family Medicine

## 2020-06-21 ENCOUNTER — Other Ambulatory Visit: Payer: Self-pay

## 2020-06-21 ENCOUNTER — Encounter: Payer: Self-pay | Admitting: Family Medicine

## 2020-06-21 VITALS — BP 130/80 | HR 88 | Temp 98.5°F | Ht 64.0 in | Wt 197.0 lb

## 2020-06-21 DIAGNOSIS — Z634 Disappearance and death of family member: Secondary | ICD-10-CM | POA: Diagnosis not present

## 2020-06-21 NOTE — Patient Instructions (Signed)
My note will be done by the end of the day.  Let me know if you need anything.

## 2020-06-21 NOTE — Progress Notes (Signed)
Chief Complaint  Patient presents with  . Anxiety    Subjective: Patient is a 39 y.o. male here for f/u.  Dealing with anxiety/bereavement. Started on Zoloft, compliant, no AE's. Not doing well. Still very tearful, poor concentration. He is following with a counselor now. Psychiatry referral placed and in process. No HI or SI. No self medication. Not currently working.   Past Medical History:  Diagnosis Date  . HSV-1 (herpes simplex virus 1) infection   . HSV-2 infection   . Kidney stones     Objective: BP 130/80 (BP Location: Left Arm, Patient Position: Sitting, Cuff Size: Normal)   Pulse 88   Temp 98.5 F (36.9 C) (Oral)   Ht 5\' 4"  (1.626 m)   Wt 197 lb (89.4 kg)   SpO2 97%   BMI 33.81 kg/m  General: Awake, appears stated age Lungs: No accessory muscle use Psych: appears agitated today, memory sometimes lapsing with recall, shaking his leg during exam  Assessment and Plan: Bereavement  Pt will continue seeing counseling team. Cont Zoloft 50 mg/d. He did have some lapses in response time which I attribute to memory issues from bereavement. He is also shaking his leg, indicative of psychomotor agitation. He is sitting on the edge of his seat rather than leaning against the back rest suggesting being more agitated today than his usual baseline.  He is in the process of seeing a psychiatrist who will further determine his leave.  The patient voiced understanding and agreement to the plan.  [place], DO 06/21/20  11:35 AM

## 2020-06-28 ENCOUNTER — Ambulatory Visit: Payer: BC Managed Care – PPO | Admitting: Family Medicine

## 2020-07-05 DIAGNOSIS — F333 Major depressive disorder, recurrent, severe with psychotic symptoms: Secondary | ICD-10-CM | POA: Diagnosis not present

## 2020-07-14 DIAGNOSIS — F333 Major depressive disorder, recurrent, severe with psychotic symptoms: Secondary | ICD-10-CM | POA: Diagnosis not present

## 2020-08-30 ENCOUNTER — Encounter: Payer: Self-pay | Admitting: Family Medicine

## 2020-08-30 ENCOUNTER — Other Ambulatory Visit: Payer: Self-pay

## 2020-08-30 ENCOUNTER — Ambulatory Visit: Payer: BC Managed Care – PPO | Admitting: Family Medicine

## 2020-08-30 VITALS — BP 108/68 | HR 75 | Temp 98.2°F | Ht 64.0 in | Wt 201.0 lb

## 2020-08-30 DIAGNOSIS — Z634 Disappearance and death of family member: Secondary | ICD-10-CM

## 2020-08-30 MED ORDER — ESCITALOPRAM OXALATE 10 MG PO TABS: 10.0000 mg | ORAL_TABLET | Freq: Every day | ORAL | 2 refills | Status: DC

## 2020-08-30 NOTE — Patient Instructions (Signed)
Glad you are back to work.   Let me know if anything needs adjusting.   Let me know if you decide to go back on a medication.   Let us know if you need anything.

## 2020-08-30 NOTE — Progress Notes (Signed)
Chief Complaint  Patient presents with  . Form Completion    Subjective: Patient is a 39 y.o. male here for completion of form.  The patient has been dealing with bereavement since the passing of his mother on 04/21/2020.  He was placed on Zoloft which he is no longer taking.  He is following with the counseling team since the end of October.  He needs FMLA so he will have time to attend these sessions.  He is back working.  Things are improving but still bothersome to him.  Past Medical History:  Diagnosis Date  . HSV-1 (herpes simplex virus 1) infection   . HSV-2 infection   . Kidney stones     Objective: BP 108/68 (BP Location: Left Arm, Patient Position: Sitting, Cuff Size: Normal)   Pulse 75   Temp 98.2 F (36.8 C) (Oral)   Ht 5\' 4"  (1.626 m)   Wt 201 lb (91.2 kg)   SpO2 96%   BMI 34.50 kg/m  General: Awake, appears stated age Lungs: No accessory muscle use Psych: Age appropriate judgment and insight, normal affect and mood  Assessment and Plan: Bereavement  I did complete his form requesting 3 hours per session twice per week.  He knows he really needs continuously.  We will stop all of his medication as he does not feel he needs any further.  He will let know if this changes. Follow-up for his physical as originally scheduled. The patient voiced understanding and agreement to the plan.  [place], DO 08/30/20  11:47 AM

## 2020-10-01 IMAGING — DX DG FOOT COMPLETE 3+V*L*
3 series · 3 of 3 positions shown · non-contrast
Comparison: None.

CLINICAL DATA: Heel pain for months

EXAM:
LEFT FOOT - COMPLETE 3+ VIEW

[foot ap]
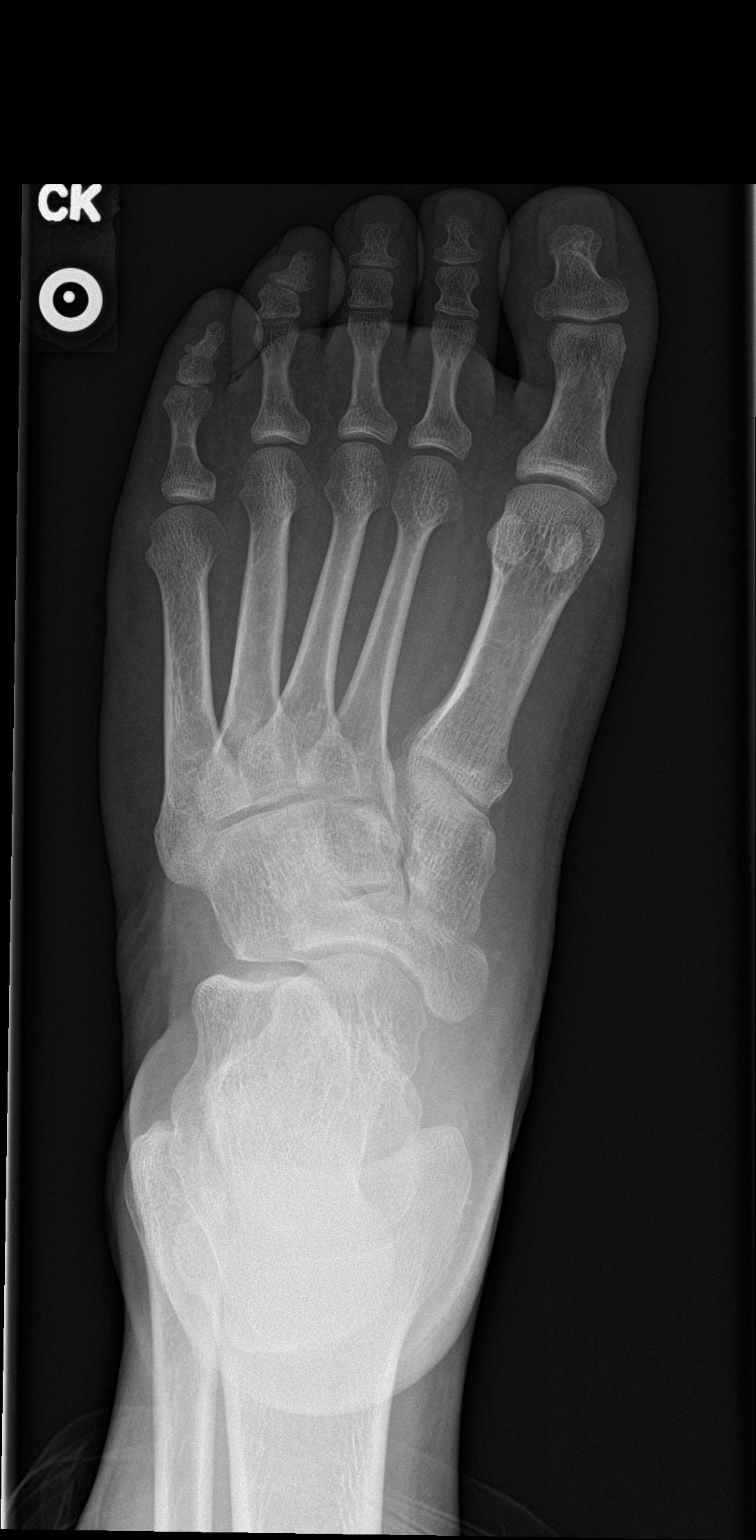

[foot obl]
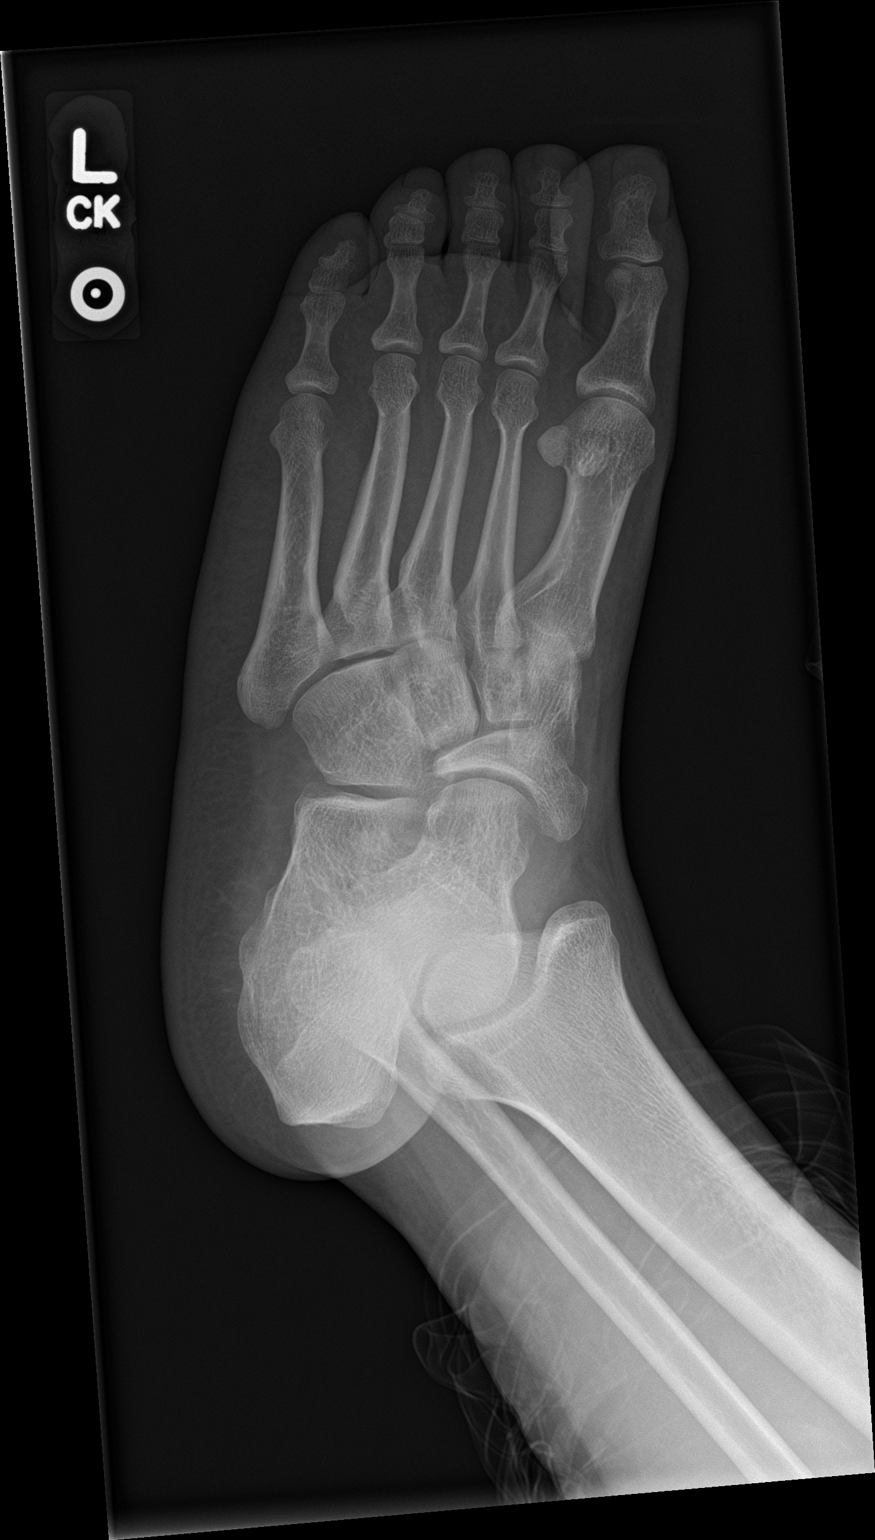

[foot lat]
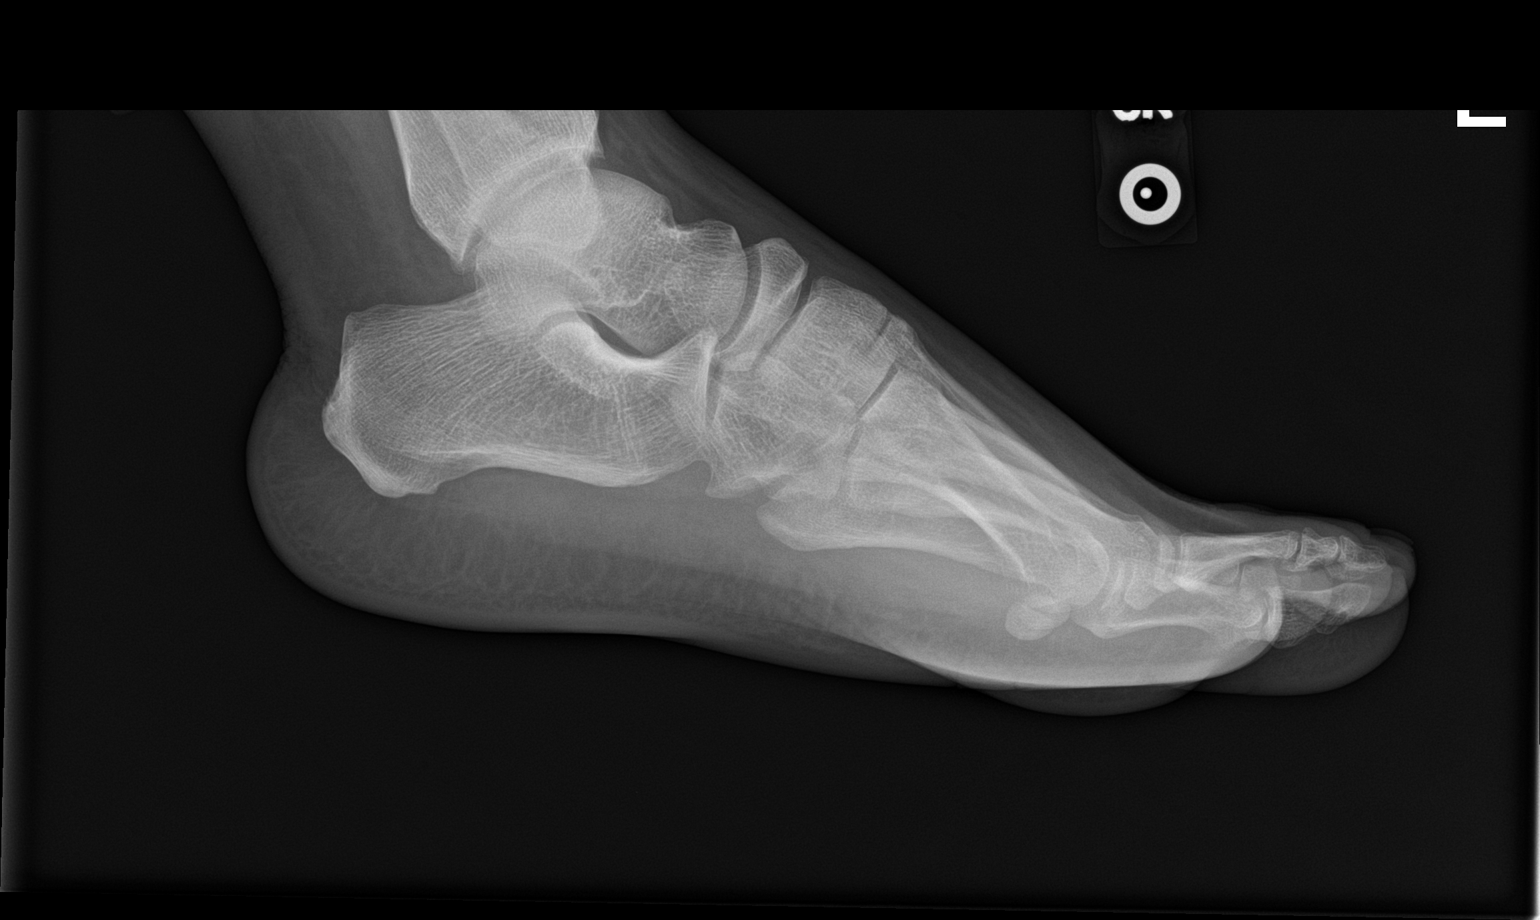

[3 of 3 positions shown; findings below may reference images not displayed]

FINDINGS: No acute bony abnormality. Specifically, no fracture, subluxation,
or dislocation. No significant heel spur. Soft tissues are
unremarkable.
IMPRESSION: No acute bony abnormality.

## 2020-11-09 ENCOUNTER — Telehealth (INDEPENDENT_AMBULATORY_CARE_PROVIDER_SITE_OTHER): Payer: BC Managed Care – PPO | Admitting: Family Medicine

## 2020-11-09 ENCOUNTER — Other Ambulatory Visit: Payer: Self-pay

## 2020-11-09 ENCOUNTER — Encounter: Payer: Self-pay | Admitting: Family Medicine

## 2020-11-09 DIAGNOSIS — J45909 Unspecified asthma, uncomplicated: Secondary | ICD-10-CM | POA: Insufficient documentation

## 2020-11-09 DIAGNOSIS — J4520 Mild intermittent asthma, uncomplicated: Secondary | ICD-10-CM | POA: Diagnosis not present

## 2020-11-09 DIAGNOSIS — Z20822 Contact with and (suspected) exposure to covid-19: Secondary | ICD-10-CM | POA: Diagnosis not present

## 2020-11-09 NOTE — Progress Notes (Signed)
Chief Complaint  Patient presents with  . Form Completion    Subjective: Patient is a 40 y.o. male here for f/u. Due to COVID-19 pandemic, we are interacting via web portal for an electronic face-to-face visit. I verified patient's ID using 2 identifiers. Patient agreed to proceed with visit via this method. Patient is at home, I am at office. Patient and I are present for visit.   Patient has a history of allergy induced asthma.  He left his left job because of this.  He is on several inhalers for this.  He has been working from home throughout the pandemic.  His work place of location is now requesting people return to the office but given the option to work from home.  Given his history of this, he would prefer to avoid any risk of Covid as cubicles are closer than 6 feet together.  He needs paperwork detailing his diagnosis and reasoning for preferring to do his work at home.  He has no physical/musculoskeletal issues at this time.  Past Medical History:  Diagnosis Date  . HSV-1 (herpes simplex virus 1) infection   . HSV-2 infection   . Kidney stones     Objective: No conversational dyspnea Age appropriate judgment and insight Nml affect and mood  Assessment and Plan: Mild intermittent extrinsic asthma without complication  Form for work filled out. As he is working from home, he requires no meds at this time. He will pick up.  The patient voiced understanding and agreement to the plan.  Jilda Roche [place], DO 11/09/20  3:39 PM

## 2020-11-14 ENCOUNTER — Ambulatory Visit: Payer: BC Managed Care – PPO | Admitting: Family Medicine

## 2020-11-17 ENCOUNTER — Telehealth: Payer: Self-pay | Admitting: Family Medicine

## 2020-11-17 NOTE — Telephone Encounter (Signed)
Caller: [employer] ([employer] [place]) Call back # [phone]  They are not able to read form. Would like a call back

## 2020-11-18 NOTE — Telephone Encounter (Signed)
Informed the patient PCP will complete form//but will not make a phone call

## 2020-11-18 NOTE — Telephone Encounter (Signed)
Called Joy informed of PCP's explanation from page 2.

## 2020-11-18 NOTE — Telephone Encounter (Signed)
Caller: Joy Call back # [phone]  Would like a call back. States they are not able to understand the writing.

## 2021-03-01 ENCOUNTER — Ambulatory Visit: Payer: BC Managed Care – PPO | Attending: Internal Medicine

## 2021-03-01 DIAGNOSIS — Z20822 Contact with and (suspected) exposure to covid-19: Secondary | ICD-10-CM | POA: Diagnosis not present

## 2021-03-01 DIAGNOSIS — Z23 Encounter for immunization: Secondary | ICD-10-CM

## 2021-03-01 NOTE — Progress Notes (Signed)
   Covid-19 Vaccination Clinic  Name:  Vernon Francis    MRN: [id number] DOB: Apr 14, 1981  03/01/2021  [name] was observed post Covid-19 immunization for 15 minutes without incident. He was provided with Vaccine Information Sheet and instruction to access the V-Safe system.   [name] was instructed to call 911 with any severe reactions post vaccine: Marland Kitchen Difficulty breathing  . Swelling of face and throat  . A fast heartbeat  . A bad rash all over body  . Dizziness and weakness   Immunizations Administered    Name Date Dose VIS Date Route   PFIZER Comrnaty(Gray TOP) Covid-19 Vaccine 03/01/2021 10:07 AM 0.3 mL 09/01/2020 Intramuscular   Manufacturer: [employer], [employer]   Lot: [id number]   NDC: [phone]

## 2021-03-02 ENCOUNTER — Other Ambulatory Visit (HOSPITAL_BASED_OUTPATIENT_CLINIC_OR_DEPARTMENT_OTHER): Payer: Self-pay

## 2021-03-02 MED ORDER — COVID-19 MRNA VAC-TRIS(PFIZER) 30 MCG/0.3ML IM SUSP
INTRAMUSCULAR | 0 refills | Status: DC
  Filled 2021-03-02: qty 0.3, 1d supply, fill #0

## 2021-03-08 ENCOUNTER — Other Ambulatory Visit: Payer: Self-pay | Admitting: Family Medicine

## 2021-03-08 DIAGNOSIS — Z3141 Encounter for fertility testing: Secondary | ICD-10-CM

## 2021-03-22 ENCOUNTER — Encounter: Payer: Self-pay | Admitting: Family Medicine

## 2021-03-22 ENCOUNTER — Other Ambulatory Visit: Payer: Self-pay

## 2021-03-22 ENCOUNTER — Encounter: Payer: BC Managed Care – PPO | Admitting: Family Medicine

## 2021-03-22 ENCOUNTER — Ambulatory Visit (INDEPENDENT_AMBULATORY_CARE_PROVIDER_SITE_OTHER): Payer: BC Managed Care – PPO | Admitting: Family Medicine

## 2021-03-22 VITALS — BP 128/78 | HR 69 | Temp 98.3°F | Ht 64.0 in | Wt 199.1 lb

## 2021-03-22 DIAGNOSIS — Z1159 Encounter for screening for other viral diseases: Secondary | ICD-10-CM | POA: Diagnosis not present

## 2021-03-22 DIAGNOSIS — Z125 Encounter for screening for malignant neoplasm of prostate: Secondary | ICD-10-CM | POA: Diagnosis not present

## 2021-03-22 DIAGNOSIS — Z0000 Encounter for general adult medical examination without abnormal findings: Secondary | ICD-10-CM

## 2021-03-22 NOTE — Patient Instructions (Signed)
Give [place] 2-3 business days to get the results of your labs back.   Keep the diet clean and stay active.  We will fax your completed form today or tomorrow.   Let us know if you need anything.

## 2021-03-22 NOTE — Progress Notes (Signed)
Chief Complaint  Patient presents with   Annual Exam    Well Male Vernon Francis is here for a complete physical.   His last physical was >1 year ago.  Current diet: in general, a "healthy" diet.   Current exercise: lifting wts, cardio Weight trend: stable Fatigue out of ordinary? No. Seat belt? Yes.    Health maintenance Tetanus- Yes HIV- Yes Hep C- No  Past Medical History:  Diagnosis Date   HSV-1 (herpes simplex virus 1) infection    HSV-2 infection    Kidney stones      Past Surgical History:  Procedure Laterality Date   NO PAST SURGERIES      Medications  Current Outpatient Medications on File Prior to Visit  Medication Sig Dispense Refill   COVID-19 mRNA Vac-TriS, Pfizer, SUSP injection Inject into the muscle. 0.3 mL 0   Allergies No Known Allergies  Family History Family History  Problem Relation Age of Onset   Hypertension Mother    Diabetes Father     Review of Systems: Constitutional: no fevers or chills Eye:  no recent significant change in vision Ear/Nose/Mouth/Throat:  Ears:  no hearing loss Nose/Mouth/Throat:  no complaints of nasal congestion, no sore throat Cardiovascular:  no chest pain Respiratory:  no shortness of breath Gastrointestinal:  no abdominal pain, no change in bowel habits GU:  Male: negative for dysuria, frequency, and incontinence Musculoskeletal/Extremities:  no pain of the joints Integumentary (Skin/Breast):  no abnormal skin lesions reported Neurologic:  no headaches Endocrine: No unexpected weight changes Hematologic/Lymphatic:  no night sweats  Exam BP 128/78   Pulse 69   Temp 98.3 F (36.8 C) (Oral)   Ht 5\' 4"  (1.626 m)   Wt 199 lb 2 oz (90.3 kg)   SpO2 99%   BMI 34.18 kg/m  General:  well developed, well nourished, in no apparent distress Skin:  no significant moles, warts, or growths Head:  no masses, lesions, or tenderness Eyes:  pupils equal and round, sclera anicteric without injection Ears:   canals without lesions, TMs shiny without retraction, no obvious effusion, no erythema Nose:  nares patent, septum midline, mucosa normal Throat/Pharynx:  lips and gingiva without lesion; tongue and uvula midline; non-inflamed pharynx; no exudates or postnasal drainage Neck: neck supple without adenopathy, thyromegaly, or masses Lungs:  clear to auscultation, breath sounds equal bilaterally, no respiratory distress Cardio:  regular rate and rhythm, no bruits, no LE edema Abdomen:  abdomen soft, nontender; bowel sounds normal; no masses or organomegaly Rectal: Deferred Musculoskeletal:  symmetrical muscle groups noted without atrophy or deformity Extremities:  no clubbing, cyanosis, or edema, no deformities, no skin discoloration Neuro:  gait normal; deep tendon reflexes normal and symmetric Psych: well oriented with normal range of affect and appropriate judgment/insight  Assessment and Plan  Well adult exam - Plan: CBC, Comprehensive metabolic panel, Lipid panel  Screening for prostate cancer - Plan: PSA  Encounter for hepatitis C screening test for low risk patient - Plan: Hepatitis C antibody   Well 40 y.o. male. Counseled on diet and exercise. Counseled on risks and benefits of prostate cancer screening with PSA. The patient agrees to undergo screening.  Other orders as above. Follow up in 1 yr pending the above workup. The patient voiced understanding and agreement to the plan.  46 [place], DO 03/22/21 2:02 PM

## 2021-03-23 LAB — COMPREHENSIVE METABOLIC PANEL
ALT: 60 U/L — ABNORMAL HIGH (ref 0–53)
AST: 38 U/L — ABNORMAL HIGH (ref 0–37)
Albumin: 4.8 g/dL (ref 3.5–5.2)
Alkaline Phosphatase: 57 U/L (ref 39–117)
BUN: 16 mg/dL (ref 6–23)
CO2: 28 mEq/L (ref 19–32)
Calcium: 9.8 mg/dL (ref 8.4–10.5)
Chloride: 103 mEq/L (ref 96–112)
Creatinine, Ser: 0.98 mg/dL (ref 0.40–1.50)
GFR: 95.66 mL/min (ref >60.00)
Glucose, Bld: 87 mg/dL (ref 70–99)
Potassium: 4.1 mEq/L (ref 3.5–5.1)
Sodium: 140 mEq/L (ref 135–145)
Total Bilirubin: 0.6 mg/dL (ref 0.2–1.2)
Total Protein: 7.8 g/dL (ref 6.0–8.3)

## 2021-03-23 LAB — LIPID PANEL
Cholesterol: 231 mg/dL — ABNORMAL HIGH (ref 0–200)
HDL: 42.4 mg/dL (ref >39.00)
LDL Cholesterol: 163 mg/dL — ABNORMAL HIGH (ref 0–99)
NonHDL: 188.63
Total CHOL/HDL Ratio: 5
Triglycerides: 130 mg/dL (ref 0.0–149.0)
VLDL: 26 mg/dL (ref 0.0–40.0)

## 2021-03-23 LAB — CBC
HCT: 43.8 % (ref 39.0–52.0)
Hemoglobin: 14.3 g/dL (ref 13.0–17.0)
MCHC: 32.5 g/dL (ref 30.0–36.0)
MCV: 86.1 fl (ref 78.0–100.0)
Platelets: 152 10*3/uL (ref 150.0–400.0)
RBC: 5.09 Mil/uL (ref 4.22–5.81)
RDW: 14.6 % (ref 11.5–15.5)
WBC: 5.5 10*3/uL (ref 4.0–10.5)

## 2021-03-23 LAB — PSA: PSA: 0.93 ng/mL (ref 0.10–4.00)

## 2021-03-23 LAB — HEPATITIS C ANTIBODY
Hepatitis C Ab: NONREACTIVE
SIGNAL TO CUT-OFF: 0.73 (ref <1.00)

## 2021-04-04 ENCOUNTER — Telehealth: Payer: Self-pay | Admitting: Family Medicine

## 2021-04-04 NOTE — Telephone Encounter (Signed)
Has been completed and faxed

## 2021-04-04 NOTE — Telephone Encounter (Signed)
Patient would like his work paperwork to be fax to [employer] at [phone]

## 2021-04-19 ENCOUNTER — Encounter: Payer: Self-pay | Admitting: Family Medicine

## 2021-04-19 ENCOUNTER — Other Ambulatory Visit: Payer: Self-pay

## 2021-04-19 ENCOUNTER — Ambulatory Visit: Payer: BC Managed Care – PPO | Admitting: Family Medicine

## 2021-04-19 VITALS — BP 120/82 | HR 81 | Temp 98.5°F | Ht 64.0 in | Wt 200.5 lb

## 2021-04-19 DIAGNOSIS — R3589 Other polyuria: Secondary | ICD-10-CM

## 2021-04-19 LAB — POC URINALSYSI DIPSTICK (AUTOMATED)
Bilirubin, UA: NEGATIVE
Blood, UA: NEGATIVE
Glucose, UA: NEGATIVE
Ketones, UA: NEGATIVE
Leukocytes, UA: NEGATIVE
Nitrite, UA: NEGATIVE
Protein, UA: NEGATIVE
Spec Grav, UA: 1.02 (ref 1.010–1.025)
Urobilinogen, UA: 0.2 E.U./dL
pH, UA: 6 (ref 5.0–8.0)

## 2021-04-19 NOTE — Patient Instructions (Signed)
I want your urine to be clear with a yellow tint.  Give [place] 2-3 business days to get the results of your labs back.   Your urine is normal.   Cut back on Red Bull energy drinks.   Let us know if you need anything.

## 2021-04-19 NOTE — Progress Notes (Signed)
Chief Complaint  Patient presents with   Urinary Frequency    Subjective: Patient is a 40 y.o. male here for freq urination.  Over the past 2 d, pt has been urinating frequently. Does not void much. Has been going 5-6 times per hour. Wakes up 2 times per night to urinate which is more normal. He has made a conscious effort to drink more water. Drinks "a lot" of [employer] energy drinks. No hx of diabetes or DM insipidus. He does not consume much caffeine. No alcohol, no constipation.   Past Medical History:  Diagnosis Date   HSV-1 (herpes simplex virus 1) infection    HSV-2 infection    Kidney stones     Objective: BP 120/82   Pulse 81   Temp 98.5 F (36.9 C) (Oral)   Ht 5\' 4"  (1.626 m)   Wt 200 lb 8 oz (90.9 kg)   SpO2 96%   BMI 34.42 kg/m  General: Awake, appears stated age Abd: BS+, S, NT, ND Heart: RRR, no murmurs Lungs: CTAB, no rales, wheezes or rhonchi. No accessory muscle use Psych: Age appropriate judgment and insight, normal affect and mood  Assessment and Plan: Polyuria - Plan: Hemoglobin A1c, Comprehensive metabolic panel, POCT Urinalysis Dipstick (Automated)  New problem, uncertain prognosis.  Urinalysis is unremarkable.  He is not having any signs of symptoms of prostate etiologies or infection.  Will check lytes and renal function, screen for diabetes.  His father does have diabetes.  If work-up is unremarkable, will trial Myrbetriq versus Ditropan.  He could back off of his fluid intake for couple weeks and see if this helps as well. The patient voiced understanding and agreement to the plan.  [place], DO 04/19/21  4:37 PM

## 2021-04-20 ENCOUNTER — Other Ambulatory Visit: Payer: Self-pay | Admitting: Family Medicine

## 2021-04-20 LAB — COMPREHENSIVE METABOLIC PANEL
ALT: 54 U/L — ABNORMAL HIGH (ref 0–53)
AST: 31 U/L (ref 0–37)
Albumin: 4.7 g/dL (ref 3.5–5.2)
Alkaline Phosphatase: 62 U/L (ref 39–117)
BUN: 11 mg/dL (ref 6–23)
CO2: 28 mEq/L (ref 19–32)
Calcium: 9.7 mg/dL (ref 8.4–10.5)
Chloride: 100 mEq/L (ref 96–112)
Creatinine, Ser: 1.15 mg/dL (ref 0.40–1.50)
GFR: 78.91 mL/min (ref >60.00)
Glucose, Bld: 91 mg/dL (ref 70–99)
Potassium: 4.2 mEq/L (ref 3.5–5.1)
Sodium: 136 mEq/L (ref 135–145)
Total Bilirubin: 0.5 mg/dL (ref 0.2–1.2)
Total Protein: 7.7 g/dL (ref 6.0–8.3)

## 2021-04-20 LAB — HEMOGLOBIN A1C: Hgb A1c MFr Bld: 6.4 % (ref 4.6–6.5)

## 2021-04-20 MED ORDER — OXYBUTYNIN CHLORIDE ER 5 MG PO TB24
5.0000 mg | ORAL_TABLET | Freq: Every day | ORAL | 1 refills | Status: DC
Start: 2021-04-20 — End: 2021-08-04

## 2021-05-03 DIAGNOSIS — Z3141 Encounter for fertility testing: Secondary | ICD-10-CM | POA: Diagnosis not present

## 2021-05-22 ENCOUNTER — Other Ambulatory Visit: Payer: Self-pay

## 2021-05-22 ENCOUNTER — Encounter: Payer: Self-pay | Admitting: Family Medicine

## 2021-05-22 ENCOUNTER — Ambulatory Visit: Payer: BC Managed Care – PPO | Admitting: Family Medicine

## 2021-05-22 VITALS — BP 113/84 | HR 88 | Temp 98.2°F | Resp 12 | Ht 64.0 in | Wt 195.0 lb

## 2021-05-22 DIAGNOSIS — R7401 Elevation of levels of liver transaminase levels: Secondary | ICD-10-CM | POA: Diagnosis not present

## 2021-05-22 DIAGNOSIS — R7303 Prediabetes: Secondary | ICD-10-CM | POA: Diagnosis not present

## 2021-05-22 DIAGNOSIS — R3589 Other polyuria: Secondary | ICD-10-CM

## 2021-05-22 NOTE — Patient Instructions (Addendum)
Strong work on your weight loss. Keep up the good work.  Let's hold off on an ultrasound of the liver. Losing weight can help these numbers.  Let me know when you need refills.  I recommend getting the flu shot in mid October. This suggestion would change if the CDC comes out with a different recommendation.   Let us know if you need anything.

## 2021-05-22 NOTE — Progress Notes (Signed)
Chief Complaint  Patient presents with   follow up lab work    Subjective: Patient is a 40 y.o. male here for follow up OAB.  Started on Ditropan XL 5 mg/d. Reports compliance, no AE's. Working well, urinating less freq. He is content with his current dosage.   Active at work, does his cardio and core exercises. Diet has been better. He has lost around 5-7 lbs since last visit. A1c 6.4. no hx of DM. Has been eating burger wheat.   Elevated ALT. Has hx of transaminitis. He has started to lose wt as noted above. Has never had RUQ abd [place].   Past Medical History:  Diagnosis Date   HSV-1 (herpes simplex virus 1) infection    HSV-2 infection    Kidney stones     Objective: BP 113/84 (BP Location: Right Arm, Cuff Size: Large)   Pulse 88   Temp 98.2 F (36.8 C) (Oral)   Resp 12   Ht 5\' 4"  (1.626 m)   Wt 195 lb (88.5 kg)   SpO2 97%   BMI 33.47 kg/m  General: Awake, appears stated age Lungs: No accessory muscle use Psych: Age appropriate judgment and insight, normal affect and mood  Assessment and Plan: Polyuria  Prediabetes  Elevated ALT measurement  Chronic, stable. Cont Ditropan XL 5 mg/d.  Chronic, stable. Offered metformin, but he has lost 5 lbs and is starting to clean up diet. I think his A1c will be lower in the future unless he falls off of his plan. I think with wt loss this will improve also. Will hold off on for now. F/u in 5 mo for med ck, will reck labs at that time as well.  The patient voiced understanding and agreement to the plan.  [place], DO 05/22/21  3:40 PM

## 2021-06-01 DIAGNOSIS — Z20822 Contact with and (suspected) exposure to covid-19: Secondary | ICD-10-CM | POA: Diagnosis not present

## 2021-06-05 DIAGNOSIS — Z3189 Encounter for other procreative management: Secondary | ICD-10-CM | POA: Diagnosis not present

## 2021-06-22 ENCOUNTER — Other Ambulatory Visit: Payer: Self-pay | Admitting: Family Medicine

## 2021-06-22 DIAGNOSIS — N469 Male infertility, unspecified: Secondary | ICD-10-CM

## 2021-06-22 NOTE — Progress Notes (Signed)
r 

## 2021-06-27 NOTE — Telephone Encounter (Signed)
Called patient and made him an appointment.

## 2021-06-28 ENCOUNTER — Ambulatory Visit: Payer: BC Managed Care – PPO | Admitting: Medical

## 2021-06-28 ENCOUNTER — Other Ambulatory Visit: Payer: Self-pay

## 2021-06-28 ENCOUNTER — Encounter: Payer: Self-pay | Admitting: Medical

## 2021-06-28 VITALS — BP 131/90 | HR 82 | Temp 98.3°F | Resp 16 | Wt 202.2 lb

## 2021-06-28 DIAGNOSIS — R300 Dysuria: Secondary | ICD-10-CM | POA: Diagnosis not present

## 2021-06-28 DIAGNOSIS — R739 Hyperglycemia, unspecified: Secondary | ICD-10-CM

## 2021-06-28 DIAGNOSIS — R7303 Prediabetes: Secondary | ICD-10-CM

## 2021-06-28 DIAGNOSIS — R350 Frequency of micturition: Secondary | ICD-10-CM

## 2021-06-28 LAB — POC URINALSYSI DIPSTICK (AUTOMATED)
Bilirubin, UA: NEGATIVE
Blood, UA: NEGATIVE
Glucose, UA: NEGATIVE
Leukocytes, UA: NEGATIVE
Nitrite, UA: NEGATIVE
Protein, UA: NEGATIVE
Spec Grav, UA: >=1.03 — AB (ref 1.010–1.025)
Urobilinogen, UA: 0.2 E.U./dL
pH, UA: 6 (ref 5.0–8.0)

## 2021-06-28 LAB — GLUCOSE, POCT (MANUAL RESULT ENTRY): POC Glucose: 111 mg/dl — AB (ref 70–99)

## 2021-06-28 MED ORDER — SULFAMETHOXAZOLE-TRIMETHOPRIM 800-160 MG PO TABS: 1.0000 | ORAL_TABLET | Freq: Two times a day (BID) | ORAL | 0 refills | Status: DC

## 2021-06-28 NOTE — Progress Notes (Signed)
Subjective:    Patient ID: Vernon Francis, male    DOB: 1981/01/07, 40 y.o.   MRN: [id number]  HPI  Pt states for 4 months he has frequent urination during day when working at home 8 hours. When works at [employer] no symptoms.   Pt states he drinks a lot water at home. Eating better.   Pt feels like he might not be emptying his bladder fully.  Dr. Carmelia Roller rx'd ditropan. He thinks it has helped but then past week frequency returned. He is urinating sometimes 4-5 times in an hour.   Review of Systems  Constitutional:  Negative for chills, fatigue and fever.  Respiratory:  Negative for cough, chest tightness, shortness of breath and wheezing.   Cardiovascular:  Negative for chest pain and palpitations.  Gastrointestinal:  Negative for abdominal pain.  Endocrine: Positive for polyuria. Negative for polydipsia and polyphagia.  Genitourinary:  Positive for frequency. Negative for scrotal swelling and testicular pain.  Skin:  Negative for rash.    Past Medical History:  Diagnosis Date   HSV-1 (herpes simplex virus 1) infection    HSV-2 infection    Kidney stones      Social History   Socioeconomic History   Marital status: Single    Spouse name: Not on file   Number of children: Not on file   Years of education: Not on file   Highest education level: Not on file  Occupational History   Not on file  Tobacco Use   Smoking status: Never   Smokeless tobacco: Never  Substance and Sexual Activity   Alcohol use: No   Drug use: No   Sexual activity: Not on file  Other Topics Concern   Not on file  Social History Narrative   Not on file   Social Determinants of Health   Financial Resource Strain: Not on file  Food Insecurity: Not on file  Transportation Needs: Not on file  Physical Activity: Not on file  Stress: Not on file  Social Connections: Not on file  Intimate Partner Violence: Not on file    Past Surgical History:  Procedure Laterality Date   NO PAST  SURGERIES      Family History  Problem Relation Age of Onset   Hypertension Mother    Diabetes Father     No Known Allergies  Current Outpatient Medications on File Prior to Visit  Medication Sig Dispense Refill   oxybutynin (DITROPAN XL) 5 MG 24 hr tablet Take 1 tablet (5 mg total) by mouth at bedtime. 30 tablet 1   No current facility-administered medications on file prior to visit.    BP 131/90   Pulse 82   Temp 98.3 F (36.8 C)   Resp 16   Wt 202 lb 3.2 oz (91.7 kg)   SpO2 96%   BMI 34.71 kg/m       Objective:   Physical Exam  General- No acute distress. Pleasant patient. Neck- Full range of motion, no jvd Lungs- Clear, even and unlabored. Heart- regular rate and rhythm. Neurologic- CNII- XII grossly intact.  Back- no cva pain. Abdomen- soft, nt, nd, +bs. No rebound or guarding.      Assessment & Plan:   Patient Instructions  Frequent urination x 1 wk. Considering uti vs prostatitis.   We got poct ua, urine culture and psa.(Also FS sugar in office is 111.)  Rx bactrim ds antibiotic. Continue ditropan.   On review a1c 6.4 in the summer. Recommend low  sugar diet. Will put a1c to repeat in early November.  Recommend get glucometer and check sugars daily. Can get relion brand glucometer.  Follow up in 2 weeks or as needed.

## 2021-06-28 NOTE — Addendum Note (Signed)
Addended by: Gwenevere Abbot on: 06/28/2021 04:54 PM   Modules accepted: Orders

## 2021-06-28 NOTE — Patient Instructions (Addendum)
Frequent urination x 1 wk. Considering uti vs prostatitis.   We got poct ua, urine culture and psa.(Also FS sugar in office is 111.)  Rx bactrim ds antibiotic. Continue ditropan.   On review a1c 6.4 in the summer. Recommend low sugar diet. Will put a1c to repeat in early November.  Recommend get glucometer and check sugars daily. Can get relion brand glucometer.  Follow up in 2 weeks or as needed.

## 2021-06-29 LAB — URINALYSIS
Bilirubin Urine: NEGATIVE
Hgb urine dipstick: NEGATIVE
Leukocytes,Ua: NEGATIVE
Nitrite: NEGATIVE
Specific Gravity, Urine: 1.025 (ref 1.000–1.030)
Total Protein, Urine: NEGATIVE
Urine Glucose: NEGATIVE
Urobilinogen, UA: 0.2 (ref 0.0–1.0)
pH: 6 (ref 5.0–8.0)

## 2021-06-29 LAB — URINE CULTURE
MICRO NUMBER:: 12464434
Result:: NO GROWTH
SPECIMEN QUALITY:: ADEQUATE

## 2021-06-29 LAB — PSA: PSA: 0.74 ng/mL (ref 0.10–4.00)

## 2021-07-04 ENCOUNTER — Ambulatory Visit: Payer: BC Managed Care – PPO | Admitting: Family Medicine

## 2021-08-04 ENCOUNTER — Other Ambulatory Visit: Payer: Self-pay

## 2021-08-04 ENCOUNTER — Ambulatory Visit: Payer: BC Managed Care – PPO | Admitting: Family Medicine

## 2021-08-04 ENCOUNTER — Other Ambulatory Visit: Payer: Self-pay | Admitting: Family Medicine

## 2021-08-04 ENCOUNTER — Encounter: Payer: Self-pay | Admitting: Family Medicine

## 2021-08-04 VITALS — BP 120/80 | HR 72 | Temp 98.2°F | Ht 64.0 in | Wt 202.5 lb

## 2021-08-04 DIAGNOSIS — Z111 Encounter for screening for respiratory tuberculosis: Secondary | ICD-10-CM

## 2021-08-04 DIAGNOSIS — Z0289 Encounter for other administrative examinations: Secondary | ICD-10-CM

## 2021-08-04 DIAGNOSIS — Z0184 Encounter for antibody response examination: Secondary | ICD-10-CM

## 2021-08-04 DIAGNOSIS — Z23 Encounter for immunization: Secondary | ICD-10-CM

## 2021-08-04 NOTE — Patient Instructions (Addendum)
We will be in touch regarding your results.   Bring in the form for me to fill out. Once we have the results back, we can complete it.   Let us know if you need anything.

## 2021-08-04 NOTE — Progress Notes (Signed)
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 male here for f/u.  Needs form completed for work. No IV/illicit drug use or close contact w Tb. No travel to Tb endemic areas.   Past Medical History:  Diagnosis Date   HSV-1 (herpes simplex virus 1) infection    HSV-2 infection    Kidney stones     Objective: BP 120/80   Pulse 72   Temp 98.2 F (36.8 C) (Oral)   Ht 5\' 4"  (1.626 m)   Wt 202 lb 8 oz (91.9 kg)   SpO2 97%   BMI 34.76 kg/m  General: Awake, appears stated age Heart: RRR, no LE edema Lungs: CTAB, no rales, wheezes or rhonchi. No accessory muscle use Psych: Age appropriate judgment and insight, normal affect and mood  Assessment and Plan: Encounter for completion of form with patient - Plan: DRUG MONITORING, PANEL 8 WITH CONFIRMATION, URINE  Screening-pulmonary TB - Plan: QuantiFERON-TB Gold Plus  Will drop off form. Needs Tb screen and drug screen.  The patient voiced understanding and agreement to the plan.  [place], DO 08/04/21  8:51 AM

## 2021-08-04 NOTE — Addendum Note (Signed)
Addended by: Scharlene Gloss B on: 08/04/2021 08:55 AM   Modules accepted: Orders

## 2021-08-07 ENCOUNTER — Other Ambulatory Visit: Payer: Self-pay

## 2021-08-07 ENCOUNTER — Other Ambulatory Visit (INDEPENDENT_AMBULATORY_CARE_PROVIDER_SITE_OTHER): Payer: BC Managed Care – PPO

## 2021-08-07 ENCOUNTER — Telehealth: Payer: Self-pay | Admitting: Family Medicine

## 2021-08-07 DIAGNOSIS — Z0184 Encounter for antibody response examination: Secondary | ICD-10-CM | POA: Diagnosis not present

## 2021-08-07 DIAGNOSIS — Z23 Encounter for immunization: Secondary | ICD-10-CM | POA: Diagnosis not present

## 2021-08-07 NOTE — Telephone Encounter (Signed)
Pt dropped off document to be filled out by provider [employer] Medical Record Form 7 pages) Pt would like to be called when document ready to pick up at [facility] [phone]. Document put at front office tray under providers name.

## 2021-08-08 LAB — DRUG MONITORING, PANEL 8 WITH CONFIRMATION, URINE
6 Acetylmorphine: NEGATIVE ng/mL (ref <10)
Alcohol Metabolites: NEGATIVE ng/mL (ref <500)
Amphetamines: NEGATIVE ng/mL (ref <500)
Benzodiazepines: NEGATIVE ng/mL (ref <100)
Buprenorphine, Urine: NEGATIVE ng/mL (ref <5)
Cocaine Metabolite: NEGATIVE ng/mL (ref <150)
Creatinine: 129.9 mg/dL (ref >=20.0)
MDMA: NEGATIVE ng/mL (ref <500)
Marijuana Metabolite: NEGATIVE ng/mL (ref <20)
Opiates: NEGATIVE ng/mL (ref <100)
Oxidant: NEGATIVE ug/mL (ref <200)
Oxycodone: NEGATIVE ng/mL (ref <100)
pH: 7.6 (ref 4.5–9.0)

## 2021-08-08 LAB — VARICELLA ZOSTER ANTIBODY, IGG: Varicella IgG: <135 index — ABNORMAL LOW

## 2021-08-08 LAB — QUANTIFERON-TB GOLD PLUS
Mitogen-NIL: >10 IU/mL
NIL: 0.05 IU/mL
QuantiFERON-TB Gold Plus: NEGATIVE
TB1-NIL: 0.01 IU/mL
TB2-NIL: 0 IU/mL

## 2021-08-08 LAB — MEASLES/MUMPS/RUBELLA IMMUNITY
Mumps IgG: >300 AU/mL
Rubella: 18.1 Index
Rubeola IgG: >300 AU/mL

## 2021-08-08 LAB — DM TEMPLATE

## 2021-08-09 ENCOUNTER — Ambulatory Visit (INDEPENDENT_AMBULATORY_CARE_PROVIDER_SITE_OTHER): Payer: BC Managed Care – PPO | Admitting: *Deleted

## 2021-08-09 ENCOUNTER — Other Ambulatory Visit: Payer: Self-pay

## 2021-08-09 DIAGNOSIS — Z23 Encounter for immunization: Secondary | ICD-10-CM | POA: Diagnosis not present

## 2021-08-09 NOTE — Progress Notes (Signed)
Patient here for varicella vaccine per Dr. Antoine Poche last lab note.  Patient given varicella in left arm and tolerated well.

## 2021-08-09 NOTE — Telephone Encounter (Signed)
Called left message form is ready for pick up.

## 2021-08-09 NOTE — Telephone Encounter (Signed)
Patient picked up.

## 2021-08-15 DIAGNOSIS — Z3141 Encounter for fertility testing: Secondary | ICD-10-CM | POA: Diagnosis not present

## 2021-08-15 DIAGNOSIS — N528 Other male erectile dysfunction: Secondary | ICD-10-CM | POA: Diagnosis not present

## 2021-08-22 ENCOUNTER — Encounter: Payer: Self-pay | Admitting: Family Medicine

## 2021-08-23 ENCOUNTER — Ambulatory Visit: Payer: BC Managed Care – PPO | Admitting: Family Medicine

## 2021-08-23 ENCOUNTER — Encounter: Payer: Self-pay | Admitting: Family Medicine

## 2021-08-23 VITALS — BP 117/78 | HR 107 | Temp 98.5°F | Ht 64.0 in | Wt 202.1 lb

## 2021-08-23 DIAGNOSIS — F40298 Other specified phobia: Secondary | ICD-10-CM

## 2021-08-23 NOTE — Patient Instructions (Signed)
We will fax your form.   Let us know if you need anything.

## 2021-08-23 NOTE — Progress Notes (Signed)
Chief Complaint  Patient presents with   Follow-up    paperwork    Subjective: Patient is a 40 y.o. male here for completion of form.  Pt has a fear of contracting covid. He has a young child at home. He worked from home during much of pandemic. INterested in having forms for accomodation. He works in 4 person areas. Masking is optional in certain locations. Other employees are concerned about health measure and lack of enforcement.   Past Medical History:  Diagnosis Date   HSV-1 (herpes simplex virus 1) infection    HSV-2 infection    Kidney stones     Objective: BP 117/78   Pulse (!) 107   Temp 98.5 F (36.9 C) (Oral)   Ht 5\' 4"  (1.626 m)   Wt 202 lb 2 oz (91.7 kg)   SpO2 96%   BMI 34.69 kg/m  General: Awake, appears stated age Lungs: No accessory muscle use Psych: Age appropriate judgment and insight, normal affect and mood  Assessment and Plan: Fear of contracting COVID-19  Form filled out. I do not think it is medically necessary for accommodations, which I told the patient and reflected on the form, but believe he would have better production and less angst if he were provided an opportunity to work at home or in a more isolated environment.  F/u as originally scheduled.  The patient voiced understanding and agreement to the plan.  I spent 20 min reviewing his chart and filling out a form with the patient on the same day of the visit.   [place], DO 08/23/21  11:43 AM

## 2021-08-24 ENCOUNTER — Encounter: Payer: Self-pay | Admitting: Family Medicine

## 2021-08-25 ENCOUNTER — Telehealth: Payer: BC Managed Care – PPO | Admitting: Family Medicine

## 2021-09-08 ENCOUNTER — Encounter: Payer: Self-pay | Admitting: Family Medicine

## 2021-09-25 ENCOUNTER — Encounter: Payer: Self-pay | Admitting: Family Medicine

## 2021-09-28 ENCOUNTER — Ambulatory Visit: Payer: BC Managed Care – PPO

## 2021-10-04 ENCOUNTER — Other Ambulatory Visit: Payer: Self-pay | Admitting: Family Medicine

## 2021-10-04 ENCOUNTER — Telehealth: Payer: Self-pay | Admitting: Family Medicine

## 2021-10-04 DIAGNOSIS — Z0184 Encounter for antibody response examination: Secondary | ICD-10-CM

## 2021-10-04 NOTE — Telephone Encounter (Signed)
Called left message to call back 

## 2021-10-04 NOTE — Telephone Encounter (Signed)
Probably needs Hep B surface antibody test as Hep C vaccine does not exist. OK to order. Ty.

## 2021-10-04 NOTE — Telephone Encounter (Signed)
Test ordered/lab appt scheduled. Patient is aware

## 2021-10-04 NOTE — Telephone Encounter (Signed)
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 If he already had it done, and does not have the antibodies, he would like to get an appointment so he can get a hep C vaccine. If he already got the lab work done and he does have the antibodies, he would like a copy so he can come pick it up and take to school. He states he needs it by Friday. Please advice.

## 2021-10-05 ENCOUNTER — Other Ambulatory Visit: Payer: BC Managed Care – PPO

## 2022-01-26 ENCOUNTER — Encounter: Payer: Self-pay | Admitting: Family Medicine

## 2022-01-26 ENCOUNTER — Telehealth: Payer: Self-pay | Admitting: Family Medicine

## 2022-01-26 ENCOUNTER — Ambulatory Visit (INDEPENDENT_AMBULATORY_CARE_PROVIDER_SITE_OTHER): Payer: BC Managed Care – PPO | Admitting: Family Medicine

## 2022-01-26 VITALS — BP 120/70 | HR 71 | Temp 98.0°F | Ht 64.0 in | Wt 196.1 lb

## 2022-01-26 DIAGNOSIS — Z0289 Encounter for other administrative examinations: Secondary | ICD-10-CM | POA: Diagnosis not present

## 2022-01-26 DIAGNOSIS — Z0000 Encounter for general adult medical examination without abnormal findings: Secondary | ICD-10-CM

## 2022-01-26 DIAGNOSIS — Z125 Encounter for screening for malignant neoplasm of prostate: Secondary | ICD-10-CM | POA: Diagnosis not present

## 2022-01-26 DIAGNOSIS — F411 Generalized anxiety disorder: Secondary | ICD-10-CM | POA: Diagnosis not present

## 2022-01-26 DIAGNOSIS — G43009 Migraine without aura, not intractable, without status migrainosus: Secondary | ICD-10-CM

## 2022-01-26 LAB — COMPREHENSIVE METABOLIC PANEL
ALT: 28 U/L (ref 0–53)
AST: 23 U/L (ref 0–37)
Albumin: 4.5 g/dL (ref 3.5–5.2)
Alkaline Phosphatase: 58 U/L (ref 39–117)
BUN: 18 mg/dL (ref 6–23)
CO2: 28 mEq/L (ref 19–32)
Calcium: 9.3 mg/dL (ref 8.4–10.5)
Chloride: 103 mEq/L (ref 96–112)
Creatinine, Ser: 1.04 mg/dL (ref 0.40–1.50)
GFR: 88.54 mL/min (ref >60.00)
Glucose, Bld: 92 mg/dL (ref 70–99)
Potassium: 4.1 mEq/L (ref 3.5–5.1)
Sodium: 138 mEq/L (ref 135–145)
Total Bilirubin: 0.5 mg/dL (ref 0.2–1.2)
Total Protein: 7.4 g/dL (ref 6.0–8.3)

## 2022-01-26 LAB — CBC
HCT: 42 % (ref 39.0–52.0)
Hemoglobin: 13.6 g/dL (ref 13.0–17.0)
MCHC: 32.3 g/dL (ref 30.0–36.0)
MCV: 86.9 fl (ref 78.0–100.0)
Platelets: 157 10*3/uL (ref 150.0–400.0)
RBC: 4.84 Mil/uL (ref 4.22–5.81)
RDW: 14.6 % (ref 11.5–15.5)
WBC: 5.1 10*3/uL (ref 4.0–10.5)

## 2022-01-26 LAB — LIPID PANEL
Cholesterol: 205 mg/dL — ABNORMAL HIGH (ref 0–200)
HDL: 39.5 mg/dL (ref >39.00)
LDL Cholesterol: 143 mg/dL — ABNORMAL HIGH (ref 0–99)
NonHDL: 165.61
Total CHOL/HDL Ratio: 5
Triglycerides: 114 mg/dL (ref 0.0–149.0)
VLDL: 22.8 mg/dL (ref 0.0–40.0)

## 2022-01-26 LAB — PSA: PSA: 1.2 ng/mL (ref 0.10–4.00)

## 2022-01-26 NOTE — Patient Instructions (Signed)
Give us 2-3 business days to get the results of your labs back.   Keep the diet clean and stay active.  Please get me a copy of your advanced directive form at your convenience.   Let us know if you need anything.  

## 2022-01-26 NOTE — Telephone Encounter (Signed)
Patient as appointment with PCP on 01/26/2022 ?Was given FMLA to complete. ?Form completed///faxed to ?[place] at [phone]  ?Leave Number [id number] ?

## 2022-01-26 NOTE — Telephone Encounter (Signed)
Received message from the patient question #8 was not completely filled out. ?PCP completed and has been faxed in. ?

## 2022-01-26 NOTE — Progress Notes (Signed)
Chief Complaint  ?Patient presents with  ? Annual Exam  ?  Migraine ?Breathing problems  ? ? ?Well Male ?Vernon Francis is here for a complete physical.   ?His last physical was >1 year ago.  ?Current diet: in general, a "healthy" diet.   ?Current exercise: running, starting to lift again ?Weight trend: starting to lose again ?Fatigue out of ordinary? No. ?Seat belt? Yes.   ?Advanced directive? No ? ?Health maintenance ?Tetanus- Yes ?HIV- Yes ?Hep C- Yes ? ?Pt w a hx of GAD in addition to migraines. He is requesting FMLA to attending counseling sessions and missing work for migraines. He does not take routine medication at this time, does not wish to. No SI or HI. No self medication. He is following with a Veterinary surgeon.  ? ?Past Medical History:  ?Diagnosis Date  ? HSV-1 (herpes simplex virus 1) infection   ? HSV-2 infection   ? Kidney stones   ?  ? ?Past Surgical History:  ?Procedure Laterality Date  ? NO PAST SURGERIES    ? ? ?Medications  ?Takes no meds routinely.   ? ?Allergies ?No Known Allergies ? ?Family History ?Family History  ?Problem Relation Age of Onset  ? Hypertension Mother   ? Diabetes Father   ? ? ?Review of Systems: ?Constitutional: no fevers or chills ?Eye:  no recent significant change in vision ?Ear/Nose/Mouth/Throat:  Ears:  no hearing loss ?Nose/Mouth/Throat:  no complaints of nasal congestion, no sore throat ?Cardiovascular:  no chest pain ?Respiratory:  no shortness of breath ?Gastrointestinal:  no abdominal pain, no change in bowel habits ?GU:  Male: negative for dysuria, frequency, and incontinence ?Musculoskeletal/Extremities:  no pain of the joints ?Integumentary (Skin/Breast):  no abnormal skin lesions reported ?Neurologic:  no headaches ?Endocrine: No unexpected weight changes ?Hematologic/Lymphatic:  no night sweats ? ?Exam ?BP 120/70   Pulse 71   Temp 98 ?F (36.7 ?C) (Oral)   Ht 5\' 4"  (1.626 m)   Wt 196 lb 2 oz (89 kg)   SpO2 99%   BMI 33.66 kg/m?  ?General:  well developed,  well nourished, in no apparent distress ?Skin:  no significant moles, warts, or growths ?Head:  no masses, lesions, or tenderness ?Eyes:  pupils equal and round, sclera anicteric without injection ?Ears:  canals without lesions, TMs shiny without retraction, no obvious effusion, no erythema ?Nose:  nares patent, septum midline, mucosa normal ?Throat/Pharynx:  lips and gingiva without lesion; tongue and uvula midline; non-inflamed pharynx; no exudates or postnasal drainage ?Neck: neck supple without adenopathy, thyromegaly, or masses ?Lungs:  clear to auscultation, breath sounds equal bilaterally, no respiratory distress ?Cardio:  regular rate and rhythm, no bruits, no LE edema ?Abdomen:  abdomen soft, nontender; bowel sounds normal; no masses or organomegaly ?Rectal: Deferred ?Musculoskeletal:  symmetrical muscle groups noted without atrophy or deformity ?Extremities:  no clubbing, cyanosis, or edema, no deformities, no skin discoloration ?Neuro:  gait normal; deep tendon reflexes normal and symmetric ?Psych: well oriented with normal range of affect and appropriate judgment/insight ? ?Assessment and Plan ? ?Well adult exam - Plan: CBC, Comprehensive metabolic panel, Lipid panel ? ?Encounter for completion of form with patient ? ?Screening for prostate cancer - Plan: PSA ? ?GAD (generalized anxiety disorder) ? ?Migraine without aura and without status migrainosus, not intractable  ? ?Well 41 y.o. male. ?Counseled on diet and exercise. ?Counseled on risks and benefits of prostate cancer screening with PSA. The patient agrees to undergo screening.  ?Advanced directive form provided today.  ?  FMLA filled out for GAD and migraines. Continues to politely decline meds ad counseling adequate for former and not particularly freq for latter.  ?Other orders as above. ?Follow up in 1 yr pending the above workup. ?The patient voiced understanding and agreement to the plan. ? ?Sharlene Dory, DO ?01/26/22 ?8:24 AM ? ?

## 2022-02-21 ENCOUNTER — Ambulatory Visit: Payer: BC Managed Care – PPO | Admitting: Family Medicine

## 2022-02-22 ENCOUNTER — Encounter: Payer: Self-pay | Admitting: Family Medicine

## 2022-02-27 ENCOUNTER — Encounter: Payer: Self-pay | Admitting: Family Medicine

## 2022-02-27 ENCOUNTER — Ambulatory Visit: Payer: BC Managed Care – PPO | Admitting: Family Medicine

## 2022-02-27 VITALS — BP 128/80 | HR 94 | Temp 98.0°F | Ht 64.0 in | Wt 199.5 lb

## 2022-02-27 DIAGNOSIS — Z130 Encounter for screening for diseases of the blood and blood-forming organs and certain disorders involving the immune mechanism: Secondary | ICD-10-CM

## 2022-02-27 DIAGNOSIS — J040 Acute laryngitis: Secondary | ICD-10-CM | POA: Diagnosis not present

## 2022-02-27 DIAGNOSIS — H6983 Other specified disorders of Eustachian tube, bilateral: Secondary | ICD-10-CM | POA: Diagnosis not present

## 2022-02-27 MED ORDER — PREDNISONE 20 MG PO TABS
40.0000 mg | ORAL_TABLET | Freq: Every day | ORAL | 0 refills | Status: AC
Start: 2022-02-27 — End: 2022-03-04

## 2022-02-27 NOTE — Patient Instructions (Signed)
Give [place] 4-5 business days to get the results of your labs back.   Continue to push fluids, practice good hand hygiene, and cover your mouth if you cough.  If you start having fevers, shaking or shortness of breath, seek immediate care.  Send me a message in 2 days if no better.   Let us know if you need anything.

## 2022-02-27 NOTE — Progress Notes (Signed)
Chief Complaint  Patient presents with   Hoarse    3 weeks congestion    Vernon Francis here for URI complaints.  Duration: 3 weeks  Associated symptoms: ear fullness, ear pain, sore throat, wheezing, shortness of breath, and coughing Denies: sinus congestion, sinus pain, rhinorrhea, itchy watery eyes, ear drainage, myalgia, and fevers Treatment to date: Nyquil, Dayquil, lemon tea, Ginger Sick contacts: Yes; women at work  Past Medical History:  Diagnosis Date   HSV-1 (herpes simplex virus 1) infection    HSV-2 infection    Kidney stones     Objective BP 128/80   Pulse 94   Temp 98 F (36.7 C) (Oral)   Ht 5\' 4"  (1.626 m)   Wt 199 lb 8 oz (90.5 kg)   SpO2 98%   BMI 34.24 kg/m  General: Awake, alert, appears stated age HEENT: AT, [place], ears patent b/l and TM's neg, nares patent w/o discharge, pharynx pink and without exudates, MMM Neck: No masses or asymmetry Heart: RRR Lungs: CTAB, no accessory muscle use Psych: Age appropriate judgment and insight, normal mood and affect  Laryngitis  Dysfunction of both eustachian tubes - Plan: predniSONE (DELTASONE) 20 MG tablet  Encounter for sickle-cell screening - Plan: Sickle Cell Scr  5 d pred burst 40 mg/d for ears. Send message in 2 d if no better. Continue to push fluids, practice good hand hygiene, cover mouth when coughing. Will ck for sickle cell at pt's request.  F/u prn. If starting to experience fevers, shaking, or shortness of breath, seek immediate care. Pt voiced understanding and agreement to the plan.  [place], DO 02/27/22 11:43 AM

## 2022-02-28 LAB — SICKLE CELL SCREEN: Sickle Solubility Test - HGBRFX: NEGATIVE

## 2022-03-01 DIAGNOSIS — Z31440 Encounter of male for testing for genetic disease carrier status for procreative management: Secondary | ICD-10-CM | POA: Diagnosis not present

## 2022-03-06 ENCOUNTER — Ambulatory Visit: Payer: BC Managed Care – PPO | Admitting: Family Medicine

## 2022-04-09 DIAGNOSIS — Z31440 Encounter of male for testing for genetic disease carrier status for procreative management: Secondary | ICD-10-CM | POA: Diagnosis not present

## 2022-04-09 DIAGNOSIS — Z113 Encounter for screening for infections with a predominantly sexual mode of transmission: Secondary | ICD-10-CM | POA: Diagnosis not present

## 2022-04-17 ENCOUNTER — Encounter: Payer: Self-pay | Admitting: Family Medicine

## 2022-04-17 ENCOUNTER — Ambulatory Visit (INDEPENDENT_AMBULATORY_CARE_PROVIDER_SITE_OTHER): Payer: BC Managed Care – PPO | Admitting: Medical

## 2022-04-17 ENCOUNTER — Ambulatory Visit (INDEPENDENT_AMBULATORY_CARE_PROVIDER_SITE_OTHER): Payer: BC Managed Care – PPO

## 2022-04-17 VITALS — BP 100/70 | HR 76 | Resp 18 | Ht 64.0 in | Wt 202.0 lb

## 2022-04-17 DIAGNOSIS — G8929 Other chronic pain: Secondary | ICD-10-CM

## 2022-04-17 DIAGNOSIS — M549 Dorsalgia, unspecified: Secondary | ICD-10-CM

## 2022-04-17 DIAGNOSIS — M545 Low back pain, unspecified: Secondary | ICD-10-CM | POA: Diagnosis not present

## 2022-04-17 MED ORDER — CYCLOBENZAPRINE HCL 5 MG PO TABS: ORAL_TABLET | ORAL | 0 refills | Status: DC

## 2022-04-17 MED ORDER — DICLOFENAC SODIUM 75 MG PO TBEC: 75.0000 mg | DELAYED_RELEASE_TABLET | Freq: Two times a day (BID) | ORAL | 0 refills | Status: DC

## 2022-04-17 NOTE — Progress Notes (Signed)
   Subjective:    Patient ID: Vernon Francis, male    DOB: [birth year], 41 y.o.   MRN: [id number]  HPI  Pt in today with recent back pain which he described on my chart as   "serious lower back been for a while now , I feel it a lot when I am standing. And lately I've had to got to clinical and most time found myself having to lean on something.  Would like  to come in and see what's going on "  Pt states often in past when standing pain is worse. Pain rt side mostly. 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   He states in past when working at [employer] pain would come and go. In past would be transient and respond to alleve and pain would resolve.  Pt overall states pain on and off for a year but worse past earlier this month.      Review of Systems  Constitutional:  Negative for chills and fatigue.  Respiratory:  Negative for cough, chest tightness, shortness of breath and wheezing.   Cardiovascular:  Negative for chest pain and palpitations.  Gastrointestinal:  Negative for abdominal pain.  Musculoskeletal:  Positive for back pain.       Objective:   Physical Exam  General Appearance- Not in acute distress.    Chest and Lung Exam Auscultation: Breath sounds:-Normal. Clear even and unlabored. Adventitious sounds:- No Adventitious sounds.  Cardiovascular Auscultation:Rythm - Regular, rate and rythm. Heart Sounds -Normal heart sounds.  Abdomen Inspection:-Inspection Normal.  Palpation/Perucssion: Palpation and Percussion of the abdomen reveal- Non Tender, No Rebound tenderness, No rigidity(Guarding) and No Palpable abdominal masses.  Liver:-Normal.  Spleen:- Normal.   Back No Mid lumbar spine tenderness to palpation. But rt side paralumbar/thoracic tenderness at junction.  No pain on straight leg lift. Pain on lateral movements and flexion/extension of the spine.  Lower ext neurologic  L5-S1 sensation intact bilaterally. Normal patellar reflexes  bilaterally. No foot drop bilaterally.       Assessment & Plan:   Patient Instructions  For back pain lumbar-thoracic junction region rt of midline will get lumbar xray and ask radiology tech to include bottom thoracic vertebra. Can use diclofenac for pain and inflammation. If using this not to use nsaid otc. Also low number flexeril to use at night.  After xray review may offer refer to PT since pain chronic. See if can give advise for conservative measures going forward to avoid recurrence.  Follow up date to be determined after xray review.   Esperanza Richters, PA-C

## 2022-04-17 NOTE — Patient Instructions (Signed)
For back pain lumbar-thoracic junction region rt of midline will get lumbar xray and ask radiology tech to include bottom thoracic vertebra. Can use diclofenac for pain and inflammation. If using this not to use nsaid otc. Also low number flexeril to use at night.  After xray review may offer refer to PT since pain chronic. See if can give advise for conservative measures going forward to avoid recurrence.  Follow up date to be determined after xray review.

## 2022-04-19 DIAGNOSIS — Z3189 Encounter for other procreative management: Secondary | ICD-10-CM | POA: Diagnosis not present

## 2022-07-10 ENCOUNTER — Telehealth: Payer: BC Managed Care – PPO | Admitting: Emergency Medicine

## 2022-07-10 DIAGNOSIS — R350 Frequency of micturition: Secondary | ICD-10-CM

## 2022-07-10 NOTE — Progress Notes (Signed)
Hello Vernon Francis, your symptoms do not sound like a urine infection to me, but it can be hard to express your symptoms in writing. If you believe you could have an infection in your urine, I recommend that you be seen in a face to face visit where they can test your urine.   NOTE: There will be NO CHARGE for this eVisit   If you are having a true medical emergency please call 911.      For an urgent face to face visit, [facility] has seven urgent care centers for your convenience:     [facility] [place] [facility] [place] Get Driving Directions [phone] [place], [place] [address]    [facility]) Get Driving Directions [phone] [place], [place] [address]  [facility] [place] [facility]) Get Driving Directions [phone] [address] [place],  [place]  [address]  [facility] - at [address] Get Driving Directions  [phone] [address] [place],  [place] [address]   [facility] Get Driving Directions [phone] [address] [place], [place], [place] [address]   [facility] Get Driving Directions  [phone] [address] [place], [place] [address]   [facility] [place] Get Driving Directions [phone] [address] [place], [place] [address]  Your MyChart E-visit questionnaire answers were reviewed by a board certified advanced clinical practitioner to complete your personal care plan based on your specific symptoms.  Thank you for using e-Visits.

## 2022-07-11 ENCOUNTER — Telehealth: Payer: Self-pay

## 2022-07-11 ENCOUNTER — Ambulatory Visit: Payer: BC Managed Care – PPO | Admitting: Family Medicine

## 2022-07-11 ENCOUNTER — Encounter: Payer: Self-pay | Admitting: Family Medicine

## 2022-07-11 VITALS — BP 110/76 | HR 86 | Temp 98.5°F | Ht 64.0 in | Wt 202.0 lb

## 2022-07-11 DIAGNOSIS — Z23 Encounter for immunization: Secondary | ICD-10-CM

## 2022-07-11 DIAGNOSIS — N3281 Overactive bladder: Secondary | ICD-10-CM

## 2022-07-11 MED ORDER — OXYBUTYNIN CHLORIDE ER 5 MG PO TB24: 5.0000 mg | ORAL_TABLET | Freq: Every day | ORAL | 1 refills | Status: DC

## 2022-07-11 NOTE — Addendum Note (Signed)
Addended by: Sharon Seller B on: 07/11/2022 03:20 PM   Modules accepted: Orders

## 2022-07-11 NOTE — Patient Instructions (Signed)
Cut down on caffeine intake as this will help.   Take the medication as needed.  Let us know if you need anything.

## 2022-07-11 NOTE — Progress Notes (Signed)
Chief Complaint  Patient presents with   Urinary Frequency    Subjective: Patient is a 41 y.o. male here for urine freq.  Around 2 mo has been urinating more frequently.  He is in school and has been drinking much more caffeine than usual, including after noon, in order to stay awake.  Since that time, he notices his urinary frequency has gone up.  When he cut down on caffeine, this improved.  He was on Ditropan in the past that did help with his urinary frequency.  He will urinate 2-3 times in middle the night.  He is not having any pain, bleeding, fevers, abdominal pain, constipation, hesitancy, retention, or discharge.  He is having frequency and some urgency.  Past Medical History:  Diagnosis Date   HSV-1 (herpes simplex virus 1) infection    HSV-2 infection    Kidney stones     Objective: BP 110/76 (BP Location: Left Arm, Patient Position: Sitting, Cuff Size: Normal)   Pulse 86   Temp 98.5 F (36.9 C) (Oral)   Ht 5\' 4"  (1.626 m)   Wt 202 lb (91.6 kg)   SpO2 98%   BMI 34.67 kg/m  General: Awake, appears stated age Heart: RRR, no LE edema Lungs: CTAB, no rales, wheezes or rhonchi. No accessory muscle use Abdomen: Bowel sounds present, nontender, nondistended, no masses or organomegaly Psych: Age appropriate judgment and insight, normal affect and mood  Assessment and Plan: Overactive bladder - Plan: oxybutynin (DITROPAN XL) 5 MG 24 hr tablet  Chronic, not currently stable.  Restart oxybutynin 5 mg daily.  He may only need for the next month or 2.  After he takes another test, he will be able to cut down on caffeine intake.  We could keep him on this longer term if needed though.  Follow-up as originally scheduled for his physical in May.  Flu shot today. The patient voiced understanding and agreement to the plan.  [place], DO 07/11/22  3:00 PM

## 2022-07-11 NOTE — Telephone Encounter (Signed)
Caller Name [place] Phone Number [phone] Patient Name Vernon Francis Patient DOB [birth year] Call Type Message Only Information Provided Reason for Call Request to Reschedule Office Appointment Initial Comment Caller needs to reschedule his appt, please call back. Patient request to speak to RN No Disp. Time Disposition Final User 07/11/2022 7:16:34 AM General Information Provided Yes Rhea Belton Call Closed By: Rhea Belton Transaction Date/Time: 07/11/2022 7:14:11 AM (ET)

## 2022-07-11 NOTE — Telephone Encounter (Signed)
Patient changed appt from 12:45 to 2:45 TODAY

## 2022-10-26 ENCOUNTER — Ambulatory Visit: Payer: BC Managed Care – PPO | Admitting: Family Medicine

## 2022-11-19 ENCOUNTER — Ambulatory Visit (INDEPENDENT_AMBULATORY_CARE_PROVIDER_SITE_OTHER)
Admission: RE | Admit: 2022-11-19 | Discharge: 2022-11-19 | Disposition: A | Discharge status: Home / Self Care | Payer: BC Managed Care – PPO | Source: Ambulatory Visit | Attending: Family Medicine | Admitting: Family Medicine

## 2022-11-19 ENCOUNTER — Ambulatory Visit: Payer: BC Managed Care – PPO | Admitting: Family Medicine

## 2022-11-19 ENCOUNTER — Encounter: Payer: Self-pay | Admitting: Family Medicine

## 2022-11-19 VITALS — BP 120/70 | HR 82 | Temp 98.1°F | Ht 64.0 in | Wt 200.2 lb

## 2022-11-19 DIAGNOSIS — M79672 Pain in left foot: Secondary | ICD-10-CM

## 2022-11-19 NOTE — Progress Notes (Signed)
Musculoskeletal Exam  Patient: Vernon Francis DOB: 1981/09/18  DOS: 11/19/2022  SUBJECTIVE:  Chief Complaint:   Chief Complaint  Patient presents with   Fall    Left foot pain     Vernon Francis is a 42 y.o.  male for evaluation and treatment of L foot pain.   Onset:  2 weeks ago. Missed a step and inverted ankle.  Location: top of foot/front of ankle Character:  sharp  Progression of issue:  waxing and waning Associated symptoms: walking with a limp No bruising, swelling or redness Treatment: to date has been OTC NSAIDS and acetaminophen.   Neurovascular symptoms: no  Past Medical History:  Diagnosis Date   HSV-1 (herpes simplex virus 1) infection    HSV-2 infection    Kidney stones     Objective: VITAL SIGNS: BP 120/70 (BP Location: Left Arm, Patient Position: Sitting, Cuff Size: Normal)   Pulse 82   Temp 98.1 F (36.7 C) (Oral)   Ht '5\' 4"'$  (1.626 m)   Wt 200 lb 4 oz (90.8 kg)   SpO2 99%   BMI 34.37 kg/m  Constitutional: Well formed, well developed. No acute distress. Thorax & Lungs: No accessory muscle use Musculoskeletal: L foot.   Tenderness to palpation: yes, over 4th MT dorsally There is also pain with axial loading of the fourth digit Deformity: no Ecchymosis: no Tests positive: none Tests negative: Squeeze, anterior drawer There is no pain with resisted extension of the third and fourth digits Neurologic: Normal sensory function. No focal deficits noted.  Gait is antalgic favoring the left foot. Psychiatric: Normal mood. Age appropriate judgment and insight. Alert & oriented x 3.    Assessment:  Left foot pain - Plan: DG Foot Complete Left  Plan: Ck XR. Stretches/exercises, heat, ice, Tylenol.  F/u as originally scheduled. The patient voiced understanding and agreement to the plan.   [place], DO 11/19/22  2:34 PM

## 2022-11-19 NOTE — Patient Instructions (Addendum)
Ice/cold pack over area for 10-15 min twice daily.  OK to take Tylenol 1000 mg (2 extra strength tabs) or 975 mg (3 regular strength tabs) every 6 hours as needed.  Ibuprofen 400-600 mg (2-3 over the counter strength tabs) every 6 hours as needed for pain.  Please get your X-ray done in the basement of our [place] office located on: [address] [place], [place] [address]  You do not need an appointment for that location.   Let us know if you need anything.  Ankle Exercises It is normal to feel mild stretching, pulling, tightness, or discomfort as you do these exercises, but you should stop right away if you feel sudden pain or your pain gets worse.  Stretching and range of motion exercises These exercises warm up your muscles and joints and improve the movement and flexibility of your ankle. These exercises also help to relieve pain, numbness, and tingling. Exercise A: Dorsiflexion/Plantar Flexion    Sit with your affected knee straight or bent. Do not rest your foot on anything. Flex your affected ankle to tilt the top of your foot toward your shin. Hold this position for 5 seconds. Point your toes downward to tilt the top of your foot away from your shin. Hold this position for 5 seconds. Repeat 2 times. Complete this exercise 3 times per week. Exercise B: Ankle Alphabet    Sit with your affected foot supported at your lower leg. Do not rest your foot on anything. Make sure your foot has room to move freely. Think of your affected foot as a paintbrush, and move your foot to trace each letter of the alphabet in the air. Keep your hip and knee still while you trace. Make the letters as large as you can without increasing any discomfort. Trace every letter from A to Z. Repeat 2 times. Complete this exercise 3 times per week. Strengthening exercises These exercises build strength and endurance in your ankle. Endurance is the ability to use your muscles for a long time, even after they  get tired. Exercise D: Dorsiflexors    Secure a rubber exercise band or tube to an object, such as a table leg, that will stay still when the band is pulled. Secure the other end around your affected foot. Sit on the floor, facing the object with your affected leg extended. The band or tube should be slightly tense when your foot is relaxed. Slowly flex your affected ankle and toes to bring your foot toward you. Hold this position for 3 seconds.  Slowly return your foot to the starting position, controlling the band as you do that. Do a total of 10 repetitions. Repeat 2 times. Complete this exercise 3 times per week. Exercise E: Plantar Flexors    Sit on the floor with your affected leg extended. Loop a rubber exercise band or tube around the ball of your affected foot. The ball of your foot is on the walking surface, right under your toes. The band or tube should be slightly tense when your foot is relaxed. Slowly point your toes downward, pushing them away from you. Hold this position for 3 seconds. Slowly release the tension in the band or tube, controlling smoothly until your foot is back in the starting position. Repeat for a total of 10 repetitions. Repeat 2 times. Complete this exercise 3 times per week. Exercise F: Towel Curls    Sit in a chair on a non-carpeted surface, and put your feet on the floor.  Place a towel in front of your feet.  Keeping your heel on the floor, put your affected foot on the towel. Pull the towel toward you by grabbing the towel with your toes and curling them under. Keep your heel on the floor. Let your toes relax. Grab the towel again. Keep going until the towel is completely underneath your foot. Repeat for a total of 10 repetitions. Repeat 2 times. Complete this exercise 3 times per week. Exercise G: Heel Raise ( Plantar Flexors, Standing)     Stand with your feet shoulder-width apart. Keep your weight spread evenly over the width of your feet  while you rise up on your toes. Use a wall or table to steady yourself, but try not to use it for support. If this exercise is too easy, try these options: Shift your weight toward your affected leg until you feel challenged. If told by your health care provider, lift your uninjured leg off the floor. Hold this position for 3 seconds. Repeat for a total of 10 repetitions. Repeat 2 times. Complete this exercise 3 times per week. Exercise H: Tandem Walking Stand with one foot directly in front of the other. Slowly raise your back foot up, lifting your heel before your toes, and place it directly in front of your other foot. Continue to walk in this heel-to-toe way for 10 steps or for as long as told by your health care provider. Have a countertop or wall nearby to use if needed to keep your balance, but try not to hold onto anything for support. Repeat 2 times. Complete this exercises 3 times per week. Make sure you discuss any questions you have with your health care provider. Document Released: 07/25/2005 Document Revised: 05/10/2016 Document Reviewed: 05/29/2015 Elsevier Interactive Patient Education  2018 [employer].

## 2022-11-27 ENCOUNTER — Encounter: Payer: Self-pay | Admitting: Family Medicine

## 2022-11-27 ENCOUNTER — Other Ambulatory Visit: Payer: Self-pay | Admitting: Family Medicine

## 2022-11-27 DIAGNOSIS — M79672 Pain in left foot: Secondary | ICD-10-CM

## 2022-11-28 ENCOUNTER — Ambulatory Visit: Payer: BC Managed Care – PPO | Admitting: Family Medicine

## 2023-01-07 ENCOUNTER — Encounter: Payer: Self-pay | Admitting: *Deleted

## 2023-01-18 ENCOUNTER — Telehealth (INDEPENDENT_AMBULATORY_CARE_PROVIDER_SITE_OTHER): Payer: BC Managed Care – PPO | Admitting: Family Medicine

## 2023-01-18 ENCOUNTER — Encounter: Payer: Self-pay | Admitting: Family Medicine

## 2023-01-18 DIAGNOSIS — R519 Headache, unspecified: Secondary | ICD-10-CM | POA: Diagnosis not present

## 2023-01-18 MED ORDER — SUMATRIPTAN SUCCINATE 100 MG PO TABS: 100.0000 mg | ORAL_TABLET | ORAL | 1 refills | Status: DC | PRN

## 2023-01-18 NOTE — Progress Notes (Signed)
Chief Complaint  Patient presents with   Migraine    Vernon Francis is a 42 y.o. male here for evaluation of right headache. We are interacting via web portal for an electronic face-to-face visit. I verified patient's ID using 2 identifiers. Patient agreed to proceed with visit via this method. Patient is at home, I am at office. Patient and I are present for visit.   2 weeks. Treatment: Advil Aura: No Palliation: sometimes Advil Provocation: Stress, possibly looking at a computer screen Associated symptoms: None Severity: 7/10 Denies: Gum chewing, jaw pain, injury, nausea, vomiting, sonophobia, photophobia, diplopia, vertigo, neck stiffness Currently with headache? No  Objective No conversational dyspnea Age appropriate judgment and insight Nml affect and mood  Right-sided headache - Plan: SUMAtriptan (IMITREX) 100 MG tablet  Possible hx of migraines from younger years. No red flags in hx. Will send in Imitrex to take as needed. Has stressor with a test coming up on 02/02/23, would hesitate to start qd med at this point. I see him on 5/10 for CPE, will see how he is doing then.  The patient voiced understanding and agreement to the plan.  Jilda Roche [place], [place] 9:09 AM 01/18/23

## 2023-02-01 ENCOUNTER — Ambulatory Visit (INDEPENDENT_AMBULATORY_CARE_PROVIDER_SITE_OTHER): Payer: BC Managed Care – PPO | Admitting: Family Medicine

## 2023-02-01 ENCOUNTER — Encounter: Payer: Self-pay | Admitting: Family Medicine

## 2023-02-01 VITALS — BP 126/86 | HR 68 | Temp 98.2°F | Ht 64.0 in | Wt 199.1 lb

## 2023-02-01 DIAGNOSIS — Z0000 Encounter for general adult medical examination without abnormal findings: Secondary | ICD-10-CM

## 2023-02-01 DIAGNOSIS — Z125 Encounter for screening for malignant neoplasm of prostate: Secondary | ICD-10-CM | POA: Diagnosis not present

## 2023-02-01 LAB — COMPREHENSIVE METABOLIC PANEL
ALT: 25 U/L (ref 0–53)
AST: 19 U/L (ref 0–37)
Albumin: 4.5 g/dL (ref 3.5–5.2)
Alkaline Phosphatase: 61 U/L (ref 39–117)
BUN: 13 mg/dL (ref 6–23)
CO2: 28 mEq/L (ref 19–32)
Calcium: 9 mg/dL (ref 8.4–10.5)
Chloride: 104 mEq/L (ref 96–112)
Creatinine, Ser: 0.96 mg/dL (ref 0.40–1.50)
GFR: 96.78 mL/min (ref >60.00)
Glucose, Bld: 78 mg/dL (ref 70–99)
Potassium: 3.9 mEq/L (ref 3.5–5.1)
Sodium: 139 mEq/L (ref 135–145)
Total Bilirubin: 0.4 mg/dL (ref 0.2–1.2)
Total Protein: 7.3 g/dL (ref 6.0–8.3)

## 2023-02-01 LAB — LIPID PANEL
Cholesterol: 198 mg/dL (ref 0–200)
HDL: 39.8 mg/dL (ref >39.00)
LDL Cholesterol: 131 mg/dL — ABNORMAL HIGH (ref 0–99)
NonHDL: 158.07
Total CHOL/HDL Ratio: 5
Triglycerides: 137 mg/dL (ref 0.0–149.0)
VLDL: 27.4 mg/dL (ref 0.0–40.0)

## 2023-02-01 LAB — CBC
HCT: 42.2 % (ref 39.0–52.0)
Hemoglobin: 13.9 g/dL (ref 13.0–17.0)
MCHC: 33.1 g/dL (ref 30.0–36.0)
MCV: 86.4 fl (ref 78.0–100.0)
Platelets: 169 10*3/uL (ref 150.0–400.0)
RBC: 4.88 Mil/uL (ref 4.22–5.81)
RDW: 14.3 % (ref 11.5–15.5)
WBC: 6.4 10*3/uL (ref 4.0–10.5)

## 2023-02-01 LAB — PSA: PSA: 0.78 ng/mL (ref 0.10–4.00)

## 2023-02-01 NOTE — Progress Notes (Signed)
Chief Complaint  Patient presents with   Annual Exam    Headaches     Well Male Vernon Francis is here for a complete physical.   His last physical was >1 year ago.  Current diet: in general, a "healthy" diet.   Current exercise: lift wts, elliptical machine Weight trend: stable Fatigue out of ordinary? No. Seat belt? Yes.   Advanced directive? No  Health maintenance Tetanus- Yes HIV- Yes Hep C- Yes  Past Medical History:  Diagnosis Date   HSV-1 (herpes simplex virus 1) infection    HSV-2 infection    Kidney stones      Past Surgical History:  Procedure Laterality Date   NO PAST SURGERIES      Medications  Current Outpatient Medications on File Prior to Visit  Medication Sig Dispense Refill   oxybutynin (DITROPAN XL) 5 MG 24 hr tablet Take 1 tablet (5 mg total) by mouth at bedtime. 30 tablet 1   SUMAtriptan (IMITREX) 100 MG tablet Take 1 tablet (100 mg total) by mouth every 2 (two) hours as needed for migraine. May repeat in 2 hours if headache persists or recurs. 10 tablet 1    Allergies No Known Allergies  Family History Family History  Problem Relation Age of Onset   Hypertension Mother    Diabetes Father     Review of Systems: Constitutional: no fevers or chills Eye:  no recent significant change in vision Ear/Nose/Mouth/Throat:  Ears:  no hearing loss Nose/Mouth/Throat:  no complaints of nasal congestion, no sore throat Cardiovascular:  no chest pain Respiratory:  no shortness of breath Gastrointestinal:  no abdominal pain, no change in bowel habits GU:  Male: negative for dysuria, frequency, and incontinence Musculoskeletal/Extremities:  no pain of the joints Integumentary (Skin/Breast):  no abnormal skin lesions reported Neurologic:  no headaches Endocrine: No unexpected weight changes Hematologic/Lymphatic:  no night sweats  Exam BP 126/86 (BP Location: Left Arm, Patient Position: Sitting, Cuff Size: Normal)   Pulse 68   Temp 98.2 F  (36.8 C) (Oral)   Ht 5\' 4"  (1.626 m)   Wt 199 lb 2 oz (90.3 kg)   SpO2 98%   BMI 34.18 kg/m  General:  well developed, well nourished, in no apparent distress Skin:  no significant moles, warts, or growths Head:  no masses, lesions, or tenderness Eyes:  pupils equal and round, sclera anicteric without injection Ears:  canals without lesions, TMs shiny without retraction, no obvious effusion, no erythema Nose:  nares patent, mucosa normal Throat/Pharynx:  lips and gingiva without lesion; tongue and uvula midline; non-inflamed pharynx; no exudates or postnasal drainage Neck: neck supple without adenopathy, thyromegaly, or masses Lungs:  clear to auscultation, breath sounds equal bilaterally, no respiratory distress Cardio:  regular rate and rhythm, no bruits, no LE edema Abdomen:  abdomen soft, nontender; bowel sounds normal; no masses or organomegaly Rectal: Deferred Musculoskeletal:  symmetrical muscle groups noted without atrophy or deformity Extremities:  no clubbing, cyanosis, or edema, no deformities, no skin discoloration Neuro:  gait normal; deep tendon reflexes normal and symmetric Psych: well oriented with normal range of affect and appropriate judgment/insight  Assessment and Plan  Well adult exam - Plan: CBC, Comprehensive metabolic panel, Lipid panel  Screening for prostate cancer - Plan: PSA   Well 42 y.o. male. Counseled on diet and exercise. Counseled on risks and benefits of prostate cancer screening with PSA. The patient agrees to undergo screening.  Advanced directive form provided today.  Follow up in  1 yr pending the above workup. The patient voiced understanding and agreement to the plan.  Jilda Roche [place], DO 02/01/23 1:47 PM

## 2023-02-01 NOTE — Patient Instructions (Addendum)
Keep the diet clean and stay active.  Aim to do some physical exertion for 150 minutes per week. This is typically divided into 5 days per week, 30 minutes per day. The activity should be enough to get your heart rate up. Anything is better than nothing if you have time constraints.  Please get me a copy of your advanced directive form at your convenience.   Let us know if you need anything.  Healthy Eating Plan Many factors influence your heart health, including eating and exercise habits. Heart (coronary) risk increases with abnormal blood fat (lipid) levels. Heart-healthy meal planning includes limiting unhealthy fats, increasing healthy fats, and making other small dietary changes. This includes maintaining a healthy body weight to help keep lipid levels within a normal range.  WHAT IS MY PLAN?  Your health care provider recommends that you: Drink a glass of water before meals to help with satiety. Eat slowly. An alternative to the water is to add Metamucil. This will help with satiety as well. It does contain calories, unlike water.  WHAT TYPES OF FAT SHOULD I CHOOSE? Choose healthy fats more often. Choose monounsaturated and polyunsaturated fats, such as olive oil and canola oil, flaxseeds, walnuts, almonds, and seeds. Eat more omega-3 fats. Good choices include salmon, mackerel, sardines, tuna, flaxseed oil, and ground flaxseeds. Aim to eat fish at least two times each week. Avoid foods with partially hydrogenated oils in them. These contain trans fats. Examples of foods that contain trans fats are stick margarine, some tub margarines, cookies, crackers, and other baked goods. If you are going to avoid a fat, this is the one to avoid!  WHAT GENERAL GUIDELINES DO I NEED TO FOLLOW? Check food labels carefully to identify foods with trans fats. Avoid these types of options when possible. Fill one half of your plate with vegetables and green salads. Eat 4-5 servings of vegetables per day.  A serving of vegetables equals 1 cup of raw leafy vegetables,  cup of raw or cooked cut-up vegetables, or  cup of vegetable juice. Fill one fourth of your plate with whole grains. Look for the word "whole" as the first word in the ingredient list. Fill one fourth of your plate with lean protein foods. Eat 4-5 servings of fruit per day. A serving of fruit equals one medium whole fruit,  cup of dried fruit,  cup of fresh, frozen, or canned fruit. Try to avoid fruits in cups/syrups as the sugar content can be high. Eat more foods that contain soluble fiber. Examples of foods that contain this type of fiber are apples, broccoli, carrots, beans, peas, and barley. Aim to get 20-30 g of fiber per day. Eat more home-cooked food and less restaurant, buffet, and fast food. Limit or avoid alcohol. Limit foods that are high in starch and sugar. Avoid fried foods when able. Cook foods by using methods other than frying. Baking, boiling, grilling, and broiling are all great options. Other fat-reducing suggestions include: Removing the skin from poultry. Removing all visible fats from meats. Skimming the fat off of stews, soups, and gravies before serving them. Steaming vegetables in water or broth. Lose weight if you are overweight. Losing just 5-10% of your initial body weight can help your overall health and prevent diseases such as diabetes and heart disease. Increase your consumption of nuts, legumes, and seeds to 4-5 servings per week. One serving of dried beans or legumes equals  cup after being cooked, one serving of nuts equals 1 ounces, and  one serving of seeds equals  ounce or 1 tablespoon.  WHAT ARE GOOD FOODS CAN I EAT? Grains Grainy breads (try to find bread that is 3 g of fiber per slice or greater), oatmeal, light popcorn. Whole-grain cereals. Rice and pasta, including brown rice and those that are made with whole wheat. Edamame pasta is a great alternative to grain pasta. It has a higher  protein content. Try to avoid significant consumption of white bread, sugary cereals, or pastries/baked goods.  Vegetables All vegetables. Cooked white potatoes do not count as vegetables.  Fruits All fruits, but limit pineapple and bananas as these fruits have a higher sugar content.  Meats and Other Protein Sources Lean, well-trimmed beef, veal, pork, and lamb. Chicken and [place] without skin. All fish and shellfish. Wild duck, rabbit, pheasant, and venison. Egg whites or low-cholesterol egg substitutes. Dried beans, peas, lentils, and tofu. Seeds and most nuts.  Dairy Low-fat or nonfat cheeses, including ricotta, string, and mozzarella. Skim or 1% milk that is liquid, powdered, or evaporated. Buttermilk that is made with low-fat milk. Nonfat or low-fat yogurt. Soy/Almond milk are good alternatives if you cannot handle dairy.  Beverages Water is the best for you. Sports drinks with less sugar are more desirable unless you are a highly active athlete.  Sweets and Desserts Sherbets and fruit ices. Honey, jam, marmalade, jelly, and syrups. Dark chocolate.  Eat all sweets and desserts in moderation.  Fats and Oils Nonhydrogenated (trans-free) margarines. Vegetable oils, including soybean, sesame, sunflower, olive, peanut, safflower, corn, canola, and cottonseed. Salad dressings or mayonnaise that are made with a vegetable oil. Limit added fats and oils that you use for cooking, baking, salads, and as spreads.  Other Cocoa powder. Coffee and tea. Most condiments.  The items listed above may not be a complete list of recommended foods or beverages. Contact your dietitian for more options.

## 2023-02-14 ENCOUNTER — Encounter: Payer: Self-pay | Admitting: Family Medicine

## 2023-02-20 ENCOUNTER — Encounter: Payer: Self-pay | Admitting: Family Medicine

## 2023-02-20 ENCOUNTER — Telehealth (INDEPENDENT_AMBULATORY_CARE_PROVIDER_SITE_OTHER): Payer: BC Managed Care – PPO | Admitting: Family Medicine

## 2023-02-20 DIAGNOSIS — G43009 Migraine without aura, not intractable, without status migrainosus: Secondary | ICD-10-CM

## 2023-02-20 MED ORDER — RIZATRIPTAN BENZOATE 10 MG PO TABS
10.0000 mg | ORAL_TABLET | ORAL | 1 refills | Status: DC | PRN
Start: 2023-02-20 — End: 2023-12-04

## 2023-02-20 NOTE — Progress Notes (Signed)
Chief Complaint  Patient presents with   paperwork    Headaches     Subjective: Patient is a 42 y.o. male here for f/u headaches. We are interacting via web portal for an electronic face-to-face visit. I verified patient's ID using 2 identifiers. Patient agreed to proceed with visit via this method. Patient is at home, I am at office. Patient and I are present for visit.   Pt has been working 5 d/mo at home. Requesting more days at home to work due to the lights affecting his headaches. He takes Imitrex which does help.  He has dizziness within a few minutes of taking it which is not conducive to interacting with clients.  No other adverse effects.  He has never been on any other medication.  He prefers to avoid chronic medication.  No new neurologic signs or symptoms.  No recent trauma.  Past Medical History:  Diagnosis Date   HSV-1 (herpes simplex virus 1) infection    HSV-2 infection    Kidney stones     Objective: No conversational dyspnea Age appropriate judgment and insight Nml affect and mood  Assessment and Plan: Migraine without aura and without status migrainosus, not intractable - Plan: rizatriptan (MAXALT) 10 MG tablet  Adverse effect of chronic medication.  Stop Imitrex, start Maxalt 10 mg daily as needed.  Job accommodation request form filled out as light seems to flare his migraines.  He would prefer to avoid chronic medication.  Follow-up in 1 month. The patient voiced understanding and agreement to the plan.  Jilda Roche [place], DO 02/20/23  8:24 AM

## 2023-02-22 ENCOUNTER — Encounter: Payer: Self-pay | Admitting: Family Medicine

## 2023-02-22 NOTE — Telephone Encounter (Signed)
Patient states there is one error on the last page of his accomodation forms, he needs it to say 2025 not 2024. Form needs to be fixed, signed & initial and faxed back to [phone] attn Dena Billet. Please advise.

## 2023-09-03 ENCOUNTER — Encounter: Payer: BC Managed Care – PPO | Admitting: Family Medicine

## 2023-09-03 NOTE — Progress Notes (Signed)
I was late to our virtual appt and the pt had left the virtual exam room. We have been unable to get ahold of him. He will neither be charged nor assessed a "no show visit".

## 2023-12-03 ENCOUNTER — Encounter: Payer: Self-pay | Admitting: Family Medicine

## 2023-12-04 ENCOUNTER — Ambulatory Visit: Admitting: Family Medicine

## 2023-12-04 ENCOUNTER — Encounter: Payer: Self-pay | Admitting: Family Medicine

## 2023-12-04 ENCOUNTER — Telehealth: Payer: Self-pay | Admitting: Family Medicine

## 2023-12-04 VITALS — BP 130/82 | HR 100 | Resp 18 | Ht 64.0 in | Wt 211.0 lb

## 2023-12-04 DIAGNOSIS — G43009 Migraine without aura, not intractable, without status migrainosus: Secondary | ICD-10-CM

## 2023-12-04 DIAGNOSIS — E669 Obesity, unspecified: Secondary | ICD-10-CM | POA: Diagnosis not present

## 2023-12-04 MED ORDER — ZEPBOUND 7.5 MG/0.5ML ~~LOC~~ SOAJ: 7.5000 mg | SUBCUTANEOUS | 0 refills | Status: DC

## 2023-12-04 MED ORDER — ZEPBOUND 5 MG/0.5ML ~~LOC~~ SOAJ
5.0000 mg | SUBCUTANEOUS | 0 refills | Status: AC
Start: 2024-01-01 — End: 2024-01-29

## 2023-12-04 MED ORDER — ZEPBOUND 10 MG/0.5ML ~~LOC~~ SOAJ
10.0000 mg | SUBCUTANEOUS | 0 refills | Status: DC
Start: 2024-02-26 — End: 2024-02-10

## 2023-12-04 MED ORDER — ZEPBOUND 15 MG/0.5ML ~~LOC~~ SOAJ: 15.0000 mg | SUBCUTANEOUS | 1 refills | Status: DC

## 2023-12-04 MED ORDER — ZEPBOUND 2.5 MG/0.5ML ~~LOC~~ SOAJ
2.5000 mg | SUBCUTANEOUS | 0 refills | Status: AC
Start: 2023-12-04 — End: 2024-01-01

## 2023-12-04 MED ORDER — ZEPBOUND 12.5 MG/0.5ML ~~LOC~~ SOAJ: 12.5000 mg | SUBCUTANEOUS | 0 refills | Status: DC

## 2023-12-04 MED ORDER — RIZATRIPTAN BENZOATE 10 MG PO TABS: 10.0000 mg | ORAL_TABLET | ORAL | 3 refills | Status: AC | PRN

## 2023-12-04 NOTE — Telephone Encounter (Signed)
 Copied from CRM [id number]. Topic: Medical Record Request - Other >> Dec 04, 2023  2:50 PM Chantha C wrote: Reason for CRM: Patient had a appointment with Dr. Carmelia Roller today and forms needs to be faxed to [employer], checking on the status. Please call back at [phone].

## 2023-12-04 NOTE — Patient Instructions (Signed)
 Keep the diet clean and stay active.  Let me know if there are cost issues.  We will fax your form today.  Let us know if you need anything.

## 2023-12-04 NOTE — Addendum Note (Signed)
 Addended byConrad Sylva D on: 12/04/2023 11:39 AM   Modules accepted: Orders

## 2023-12-04 NOTE — Progress Notes (Signed)
 Chief Complaint  Patient presents with   Follow-up    Subjective: Patient is a 43 y.o. male here for completion of a form.  Pt has been working from home over the past year. He has been reportedly thriving in this environment. His accomodation form is about to expire and he is requesting renewal. Light triggers his migraines. No need for prophylaxis. Wears sunglasses if exposed to fluorescent light. Maxalt prn. Has not required it in many months.   Pt struggling with wt loss. Diet is fair. He lifts weights routinely. He does some cycling and the elliptical machine for cardio. Has never been on a medication before. Has not been thru a supervised weight loss program. Interested in a medication.   Past Medical History:  Diagnosis Date   HSV-1 (herpes simplex virus 1) infection    HSV-2 infection    Kidney stones     Objective: BP 130/82 (BP Location: Left Arm, Cuff Size: Large)   Pulse 100   Resp 18   Ht 5\' 4"  (1.626 m)   Wt 211 lb (95.7 kg)   SpO2 94%   BMI 36.22 kg/m  General: Awake, appears stated age Heart: RRR Lungs: CTAB. No accessory muscle use Psych: Age appropriate judgment and insight, normal affect and mood  Assessment and Plan: Migraine without aura and without status migrainosus, not intractable  Obesity (BMI 30-39.9) - Plan: tirzepatide (ZEPBOUND) 10 MG/0.5ML Pen, tirzepatide (ZEPBOUND) 12.5 MG/0.5ML Pen, tirzepatide (ZEPBOUND) 15 MG/0.5ML Pen, tirzepatide (ZEPBOUND) 2.5 MG/0.5ML Pen, tirzepatide (ZEPBOUND) 5 MG/0.5ML Pen, tirzepatide (ZEPBOUND) 7.5 MG/0.5ML Pen  Form filled out to allow ability to work from home. Cont Maxalt prn.  Chronic, uncontrolled. Counseled on diet/exercise. Start Zepbound and titrate up. F/u in 2 mo to reck.  The patient voiced understanding and agreement to the plan.  Jilda Roche Bliss, DO 12/04/23  7:43 AM

## 2023-12-04 NOTE — Telephone Encounter (Signed)
 The insurance has been added to his chart

## 2023-12-05 ENCOUNTER — Other Ambulatory Visit (HOSPITAL_COMMUNITY): Payer: Self-pay

## 2023-12-05 ENCOUNTER — Telehealth: Payer: Self-pay | Admitting: Pharmacy Technician

## 2023-12-05 NOTE — Telephone Encounter (Signed)
 Forms faxed

## 2023-12-05 NOTE — Telephone Encounter (Signed)
 Pharmacy Patient Advocate Encounter   Received notification from CoverMyMeds that prior authorization for Zepbound 2.5MG /0.5ML pen-injectors is required/requested.   Insurance verification completed.   The patient is insured through [employer] [facility] [employer] .   Per test claim: PA required; PA submitted to above mentioned insurance via CoverMyMeds Key/confirmation #/EOC [id number] Status is pending

## 2023-12-06 NOTE — Telephone Encounter (Signed)
 Both forms where printed.

## 2023-12-06 NOTE — Telephone Encounter (Signed)
 Pharmacy Patient Advocate Encounter  Received notification from [facility] that Prior Authorization for Zepbound 2.5MG /0.5ML pen-injectors has been DENIED.  Full denial letter will be uploaded to the media tab. See denial reason below.   PA #/Case ID/Reference #: [id number]

## 2023-12-10 ENCOUNTER — Telehealth: Payer: Self-pay | Admitting: Emergency Medicine

## 2023-12-10 NOTE — Telephone Encounter (Signed)
 Copied from CRM [id number]. Topic: General - Other >> Dec 10, 2023  3:43 PM Florestine Avers wrote: Reason for CRM: Physician needs to add the start and end date that the employee will be accommodated to work from home. Patients Employer [employer] can be reached at [phone].

## 2023-12-11 NOTE — Telephone Encounter (Signed)
 Spoke with Vernon Francis -- gave her up to date start and end dates .... She wants to know how many days should pt work from home , stated it wasn't listed on form

## 2023-12-11 NOTE — Telephone Encounter (Signed)
 Left detailed message.

## 2023-12-12 ENCOUNTER — Telehealth: Payer: Self-pay

## 2023-12-12 NOTE — Telephone Encounter (Signed)
 Spoke with pt , via mychart  Copied from [employer] [id number]. Topic: General - Other >> Dec 12, 2023  9:56 AM Sim Boast F wrote: Reason for CRM: Patient request call back from [facility] regarding paperwork to clarify some information

## 2024-02-04 ENCOUNTER — Encounter: Admitting: Family Medicine

## 2024-02-10 ENCOUNTER — Encounter: Payer: Self-pay | Admitting: Family Medicine

## 2024-02-10 ENCOUNTER — Ambulatory Visit (INDEPENDENT_AMBULATORY_CARE_PROVIDER_SITE_OTHER): Admitting: Family Medicine

## 2024-02-10 VITALS — BP 114/80 | HR 74 | Temp 98.6°F | Resp 16 | Ht 64.0 in | Wt 206.0 lb

## 2024-02-10 DIAGNOSIS — Z0000 Encounter for general adult medical examination without abnormal findings: Secondary | ICD-10-CM | POA: Diagnosis not present

## 2024-02-10 DIAGNOSIS — Z1211 Encounter for screening for malignant neoplasm of colon: Secondary | ICD-10-CM | POA: Diagnosis not present

## 2024-02-10 DIAGNOSIS — Z125 Encounter for screening for malignant neoplasm of prostate: Secondary | ICD-10-CM

## 2024-02-10 NOTE — Patient Instructions (Addendum)
 Give [place] 2-3 business days to get the results of your labs back.  ? ?Keep the diet clean and stay active. ? ?Please get me a copy of your advanced directive form at your convenience.  ? ?If you do not hear anything about your referral in the next 1-2 weeks, call our office and ask for an update. ? ?Let us know if you need anything. ?

## 2024-02-10 NOTE — Progress Notes (Signed)
 Chief Complaint  Patient presents with   Annual Exam    Well Male Vernon Francis is here for a complete physical.   His last physical was >1 year ago.  Current diet: in general, diet is OK.   Current exercise: lifting wts, running Weight trend: down a few lbs Fatigue out of ordinary? No. Seat belt? Yes.   Advanced directive? No  Health maintenance Tetanus- Yes HIV- Yes Hep C- Yes  Past Medical History:  Diagnosis Date   HSV-1 (herpes simplex virus 1) infection    HSV-2 infection    Kidney stones      Past Surgical History:  Procedure Laterality Date   NO PAST SURGERIES      Medications  Current Outpatient Medications on File Prior to Visit  Medication Sig Dispense Refill   rizatriptan  (MAXALT ) 10 MG tablet Take 1 tablet (10 mg total) by mouth as needed for migraine. May repeat in 2 hours if needed 10 tablet 3    Allergies No Known Allergies  Family History Family History  Problem Relation Age of Onset   Hypertension Mother    Diabetes Father     Review of Systems: Constitutional: no fevers or chills Eye:  no recent significant change in vision Ear/Nose/Mouth/Throat:  Ears:  no hearing loss Nose/Mouth/Throat:  no complaints of nasal congestion, no sore throat Cardiovascular:  no chest pain Respiratory:  no shortness of breath Gastrointestinal:  no abdominal pain, no change in bowel habits GU:  Male: negative for dysuria, frequency, and incontinence Musculoskeletal/Extremities:  no pain of the joints Integumentary (Skin/Breast):  no abnormal skin lesions reported Neurologic:  no headaches Endocrine: No unexpected weight changes Hematologic/Lymphatic:  no night sweats  Exam BP 114/80 (BP Location: Right Arm, Patient Position: Sitting, Cuff Size: Large)   Pulse 74   Temp 98.6 F (37 C) (Oral)   Resp 16   Ht 5\' 4"  (1.626 m)   Wt 206 lb (93.4 kg)   SpO2 97%   BMI 35.36 kg/m  General:  well developed, well nourished, in no apparent distress Skin:   no significant moles, warts, or growths Head:  no masses, lesions, or tenderness Eyes:  pupils equal and round, sclera anicteric without injection Ears:  canals without lesions, TMs shiny without retraction, no obvious effusion, no erythema Nose:  nares patent, mucosa normal Throat/Pharynx:  lips and gingiva without lesion; tongue and uvula midline; non-inflamed pharynx; no exudates or postnasal drainage Neck: neck supple without adenopathy, thyromegaly, or masses Lungs:  clear to auscultation, breath sounds equal bilaterally, no respiratory distress Cardio:  regular rate and rhythm, no bruits, no LE edema Abdomen:  abdomen soft, nontender; bowel sounds normal; no masses or organomegaly Rectal: Deferred Musculoskeletal:  symmetrical muscle groups noted without atrophy or deformity Extremities:  no clubbing, cyanosis, or edema, no deformities, no skin discoloration Neuro:  gait normal; deep tendon reflexes normal and symmetric Psych: well oriented with normal range of affect and appropriate judgment/insight  Assessment and Plan  Well adult exam - Plan: CBC, Comprehensive metabolic panel with GFR, Lipid panel  Screening for prostate cancer - Plan: PSA  Screen for colon cancer - Plan: Ambulatory referral to Gastroenterology   Well 43 y.o. male. Counseled on diet and exercise. Counseled on risks and benefits of prostate cancer screening with PSA. The patient agrees to undergo screening.  Advanced directive form provided today.  CCS: refer GI now, not to have done prior to 43th bday but will get ball rolling.  Other orders as  above. Follow up in 1 yr pending the above workup. The patient voiced understanding and agreement to the plan.  Shellie Dials Bynum, DO 02/10/24 3:48 PM

## 2024-02-11 ENCOUNTER — Ambulatory Visit: Payer: Self-pay | Admitting: Family Medicine

## 2024-02-11 LAB — CBC
HCT: 44.5 % (ref 39.0–52.0)
Hemoglobin: 14.5 g/dL (ref 13.0–17.0)
MCHC: 32.5 g/dL (ref 30.0–36.0)
MCV: 85.6 fl (ref 78.0–100.0)
Platelets: 158 10*3/uL (ref 150.0–400.0)
RBC: 5.2 Mil/uL (ref 4.22–5.81)
RDW: 14.3 % (ref 11.5–15.5)
WBC: 5.7 10*3/uL (ref 4.0–10.5)

## 2024-02-11 LAB — LIPID PANEL
Cholesterol: 229 mg/dL — ABNORMAL HIGH (ref 0–200)
HDL: 42.2 mg/dL (ref >39.00)
LDL Cholesterol: 163 mg/dL — ABNORMAL HIGH (ref 0–99)
NonHDL: 186.68
Total CHOL/HDL Ratio: 5
Triglycerides: 120 mg/dL (ref 0.0–149.0)
VLDL: 24 mg/dL (ref 0.0–40.0)

## 2024-02-11 LAB — COMPREHENSIVE METABOLIC PANEL WITH GFR
ALT: 35 U/L (ref 0–53)
AST: 23 U/L (ref 0–37)
Albumin: 4.7 g/dL (ref 3.5–5.2)
Alkaline Phosphatase: 52 U/L (ref 39–117)
BUN: 20 mg/dL (ref 6–23)
CO2: 24 meq/L (ref 19–32)
Calcium: 9.3 mg/dL (ref 8.4–10.5)
Chloride: 107 meq/L (ref 96–112)
Creatinine, Ser: 1.09 mg/dL (ref 0.40–1.50)
GFR: 82.5 mL/min (ref >60.00)
Glucose, Bld: 90 mg/dL (ref 70–99)
Potassium: 3.9 meq/L (ref 3.5–5.1)
Sodium: 140 meq/L (ref 135–145)
Total Bilirubin: 0.4 mg/dL (ref 0.2–1.2)
Total Protein: 7.9 g/dL (ref 6.0–8.3)

## 2024-02-11 LAB — PSA: PSA: 1.25 ng/mL (ref 0.10–4.00)

## 2024-02-12 ENCOUNTER — Other Ambulatory Visit: Payer: Self-pay | Admitting: Family Medicine

## 2024-02-12 ENCOUNTER — Other Ambulatory Visit: Payer: Self-pay

## 2024-02-12 DIAGNOSIS — E7800 Pure hypercholesterolemia, unspecified: Secondary | ICD-10-CM

## 2024-02-12 MED ORDER — ROSUVASTATIN CALCIUM 5 MG PO TABS: 5.0000 mg | ORAL_TABLET | Freq: Every day | ORAL | 3 refills | Status: DC

## 2024-02-19 ENCOUNTER — Other Ambulatory Visit: Payer: Self-pay

## 2024-02-19 MED ORDER — ROSUVASTATIN CALCIUM 5 MG PO TABS: 5.0000 mg | ORAL_TABLET | Freq: Every day | ORAL | 3 refills | Status: DC

## 2024-02-19 MED ORDER — ROSUVASTATIN CALCIUM 5 MG PO TABS: 5.0000 mg | ORAL_TABLET | Freq: Every day | ORAL | 3 refills | Status: AC

## 2024-02-19 NOTE — Addendum Note (Signed)
 Addended by: Demica Zook M on: 02/19/2024 04:18 PM   Modules accepted: Orders

## 2024-03-30 ENCOUNTER — Ambulatory Visit: Admitting: Family Medicine

## 2024-04-08 ENCOUNTER — Ambulatory Visit: Admitting: Family Medicine

## 2024-04-20 ENCOUNTER — Ambulatory Visit (INDEPENDENT_AMBULATORY_CARE_PROVIDER_SITE_OTHER): Admitting: Family Medicine

## 2024-04-20 ENCOUNTER — Encounter: Payer: Self-pay | Admitting: Family Medicine

## 2024-04-20 VITALS — BP 120/80 | HR 100 | Temp 98.0°F | Resp 16 | Ht 64.0 in | Wt 202.0 lb

## 2024-04-20 DIAGNOSIS — E7800 Pure hypercholesterolemia, unspecified: Secondary | ICD-10-CM | POA: Diagnosis not present

## 2024-04-20 DIAGNOSIS — G8929 Other chronic pain: Secondary | ICD-10-CM

## 2024-04-20 DIAGNOSIS — M79604 Pain in right leg: Secondary | ICD-10-CM | POA: Diagnosis not present

## 2024-04-20 NOTE — Telephone Encounter (Signed)
 Printed for todays appt

## 2024-04-20 NOTE — Progress Notes (Signed)
 Chief Complaint  Patient presents with   Follow-up    Follow Up    Subjective: Hyperlipidemia Patient presents for Hyperlipidemia follow up. Currently taking Crestor  5 mg/d and compliance with treatment thus far has been good. He denies myalgias. He is adhering to a healthy diet. Exercise: Running, calisthenics No CP or SOB.  The patient is not known to have coexisting coronary artery disease.  Patient has a 5-year history of right thigh pain.  It will sometimes radiate into his lower extremity.  He does have back pain but it is not associated.  No catching, locking, or soreness of the joint.  He has never tried physical therapy.  No neurologic signs or symptoms.  Past Medical History:  Diagnosis Date   HSV-1 (herpes simplex virus 1) infection    HSV-2 infection    Kidney stones     Objective: BP 120/80 (BP Location: Left Arm, Patient Position: Sitting)   Pulse 100   Temp 98 F (36.7 C) (Oral)   Resp 16   Ht 5' 4 (1.626 m)   Wt 202 lb (91.6 kg)   SpO2 98%   BMI 34.67 kg/m  General: Awake, appears stated age HEENT: MMM Heart: RRR, no LE edema, no bruits Lungs: CTAB, no rales, wheezes or rhonchi. No accessory muscle use MSK: No TTP of the lumbar spine region.  Mild TTP over the distal anterior thigh.  No deformity or asymmetry. Neuro: Gait is normal, DTRs equal and symmetric in lower extremities.  No clonus. Psych: Age appropriate judgment and insight, normal affect and mood  Assessment and Plan: Pure hypercholesterolemia - Plan: Hepatic function panel, Lipid panel Chronic pain of right lower extremity  Chronic, stable.  Continue Crestor  5 mg daily.  Check labs.  Counseled on diet and exercise. Chronic, stable.  Stretches and exercises provided.  Tylenol, ice, heat.  If no improvement the next month, we will set him up with physical therapy. F/u in 6 months pending the above. The patient voiced understanding and agreement to the plan.  Mabel Mt [place],  [place] 04/20/24  4:33 PM

## 2024-04-20 NOTE — Patient Instructions (Addendum)
 Give us  2-3 business days to get the results of your labs back.   Keep the diet clean and stay active.  Heat (pad or rice pillow in microwave) over affected area, 10-15 minutes twice daily.   Ice/cold pack over area for 10-15 min twice daily.  OK to take Tylenol 1000 mg (2 extra strength tabs) or 975 mg (3 regular strength tabs) every 6 hours as needed.  Consider Powerstep insoles. There are very quality over the counter inserts. Shop around online and in stores. Dr. Heriberto is a cheaper alternative, though is not as high of quality.   Send a message in 1 month if you are not improving with your leg.   Let us  know if you need anything.  Quadriceps Strain Rehab It is normal to feel mild stretching, pulling, tightness, or discomfort as you do these exercises, but you should stop right away if you feel sudden pain or your pain gets worse. Stretching and range of motion exercises These exercises warm up your muscles and joints and improve the movement and flexibility of your thigh. These exercises can also help to relieve stiffness or swelling. Exercise A: Heel slides    Lie on your back with both knees straight. If this causes back discomfort, bend the knee of your healthy leg, placing your foot flat on the floor. Slowly slide your left / right heel back toward your buttocks until you feel a gentle stretch in the front of your knee or thigh. Hold for 30 seconds. Then slowly slide your heel back to the starting position. Repeat 2 times. Complete this exercise 3 times a week. Exercise B: Quadriceps stretch, prone    Lie on your abdomen on a firm surface, such as a bed or padded floor. Bend your left / right knee and hold your ankle. If you cannot reach your ankle or pant leg, loop a belt around your foot and grab the belt instead. Gently pull your heel toward your buttocks. Your knee should not slide out to the side. You should feel a stretch in the front of your thigh and knee. Hold  this position for 30 seconds. Repeat 2 times. Complete this exercise 3 times a week. Strengthening exercises These exercises build strength and endurance in your thigh. Endurance is the ability to use your muscles for a long time, even after your muscles get tired. Exercise C: Straight leg raises (quadriceps and hip flexors) Quality counts! Watch for signs that the quadriceps muscle is working to ensure that you are strengthening the correct muscles and not cheating by using healthier muscles. Lie on your back with your left / right leg extended and your other knee bent. Tense the muscles in the front of your left / right thigh. You should see your kneecap slide up or see increased dimpling just above the knee. Tighten these muscles even more and raise your leg 4-6 inches (10-15 cm) off the floor. Hold for 3 seconds. Keep the thigh muscles tense as you lower your leg. Relax the muscles slowly and completely after each repetition. Repeat 2 times. Complete this exercise 3 times a week. Exercise D: Straight leg raises (hip extensors) Lie on your belly on a bed or a firm surface with a pillow under your hips. Bend your left / right knee so your foot is straight up in the air. Tense your buttock muscles and lift your left / right thigh off the bed. Do not let your back arch. Hold this position for 3 seconds.  Slowly return to the starting position. Let your muscles relax completely before doing another repetition. Repeat 2 times. Complete this exercise 3 times a week. Exercise E: Wall sits    Follow the directions for form closely. If you do not place your feet and knees properly, this can lead to knee pain. Lean back against a smooth wall or door and walk your feet out 18-24 inches (46-61 cm) from it. Place your feet hip-width apart. Slowly slide down the wall or door until your knees bend  60-90 degrees. Keep your weight back and over your heels, not over your toes. Keep your thighs straight or  pointing slightly outward. Hold for 1 second. Use your thigh and buttock muscles to push you back up to a standing position. Keep your weight through your heels while you do this. Rest for 5 seconds in between repetitions. Repeat 2 times. Complete this exercise 3 times a week. Make sure you discuss any questions you have with your health care provider. Document Released: 09/10/2005 Document Revised: 05/17/2016 Document Reviewed: 06/14/2015 Elsevier Interactive Patient Education  [employer].

## 2024-04-21 NOTE — Addendum Note (Signed)
 Addended by: TRECIA ALMARIE GRADE on: 04/21/2024 08:23 AM   Modules accepted: Orders

## 2024-04-23 ENCOUNTER — Telehealth: Payer: Self-pay

## 2024-04-23 NOTE — Telephone Encounter (Signed)
 Copied from CRM [id number]. Topic: Clinical - Request for Lab/Test Order >> Apr 23, 2024  9:59 AM Carlyon D wrote: Reason for CRM: Pt is calling asking for his two lab orders to be redone so he can get them done at [facility] closer to [place] [facility] [place]. Please call pt when these orders are updated for him to go to [facility]. Pt states he is ok with communicating through my chart

## 2024-04-24 ENCOUNTER — Other Ambulatory Visit: Payer: Self-pay

## 2024-04-24 DIAGNOSIS — E7800 Pure hypercholesterolemia, unspecified: Secondary | ICD-10-CM

## 2024-04-24 NOTE — Addendum Note (Signed)
 Addended by: Syniah Berne M on: 04/24/2024 08:40 AM   Modules accepted: Orders

## 2024-04-24 NOTE — Addendum Note (Signed)
 Addended by: Koraima Albertsen M on: 04/24/2024 08:43 AM   Modules accepted: Orders

## 2024-04-24 NOTE — Telephone Encounter (Signed)
 Called pt was advised labs has been ordered for [facility].

## 2024-08-31 ENCOUNTER — Telehealth: Admitting: Family Medicine

## 2024-08-31 ENCOUNTER — Encounter: Payer: Self-pay | Admitting: Family Medicine

## 2024-08-31 DIAGNOSIS — S91312A Laceration without foreign body, left foot, initial encounter: Secondary | ICD-10-CM

## 2024-08-31 DIAGNOSIS — R1013 Epigastric pain: Secondary | ICD-10-CM | POA: Diagnosis not present

## 2024-08-31 DIAGNOSIS — K219 Gastro-esophageal reflux disease without esophagitis: Secondary | ICD-10-CM | POA: Diagnosis not present

## 2024-08-31 MED ORDER — MUPIROCIN 2 % EX OINT: 1.0000 | TOPICAL_OINTMENT | Freq: Two times a day (BID) | CUTANEOUS | 0 refills | Status: AC

## 2024-08-31 NOTE — Progress Notes (Signed)
 Chief Complaint  Patient presents with   Foot Injury    Foot Cut    Vernon Francis is a 43 y.o. male here for a skin complaint. We are interacting via web portal for an electronic face-to-face visit. I verified patient's ID using 2 identifiers. Patient agreed to proceed with visit via this method. Patient is at home, I am at office. Patient and I are present for visit.   Duration: 1 week Location: bottom of the foot Pruritic? No Painful? Yes Drainage? No New soaps/lotions/topicals/detergents? No Trauma? Does a lot of running Other associated symptoms: no fevers or drainage Therapies tried thus far: Kerasol  Past Medical History:  Diagnosis Date   HSV-1 (herpes simplex virus 1) infection    HSV-2 infection    Kidney stones    Obj No conversational dyspnea Age appropriate judgment and insight Nml affect and mood  Cut of skin of left foot  Moisturize skin moving forward.  Wear appropriate inserts.  Bactroban  twice daily for the next 7 days.  Let me know if there are issues. F/u prn. The patient voiced understanding and agreement to the plan.  Mabel Mt [place], DO 08/31/24 12:57 PM

## 2024-10-09 ENCOUNTER — Telehealth: Admitting: Physician Assistant

## 2024-10-09 DIAGNOSIS — R079 Chest pain, unspecified: Secondary | ICD-10-CM

## 2024-10-09 NOTE — Progress Notes (Signed)
 " Virtual Visit Consent   Vernon Francis, you are scheduled for a virtual visit with a [facility] provider today. Just as with appointments in the office, your consent must be obtained to participate. Your consent will be active for this visit and any virtual visit you may have with one of our providers in the next 365 days. If you have a MyChart account, a copy of this consent can be sent to you electronically.  As this is a virtual visit, video technology does not allow for your provider to perform a traditional examination. This may limit your provider's ability to fully assess your condition. If your provider identifies any concerns that need to be evaluated in person or the need to arrange testing (such as labs, EKG, etc.), we will make arrangements to do so. Although advances in technology are sophisticated, we cannot ensure that it will always work on either your end or our end. If the connection with a video visit is poor, the visit may have to be switched to a telephone visit. With either a video or telephone visit, we are not always able to ensure that we have a secure connection.  By engaging in this virtual visit, you consent to the provision of healthcare and authorize for your insurance to be billed (if applicable) for the services provided during this visit. Depending on your insurance coverage, you may receive a charge related to this service.  I need to obtain your verbal consent now. Are you willing to proceed with your visit today? Vernon Francis has provided verbal consent on 10/09/2024 for a virtual visit (video or telephone). Teena Shuck, [place]  Date: 10/09/2024 5:54 PM   Virtual Visit via Video Note   I, Teena Shuck, connected with  Vernon Francis  ([id number], 1981-06-14) on 10/09/24 at  6:00 PM EST by a video-enabled telemedicine application and verified that I am speaking with the correct person using two identifiers.  Location: Patient: Virtual Visit Location  Patient: Mobile Provider: Virtual Visit Location Provider: Home Office   I discussed the limitations of evaluation and management by telemedicine and the availability of in person appointments. The patient expressed understanding and agreed to proceed.    History of Present Illness: Vernon Francis is a 44 y.o. who identifies as a male who was assigned male at birth, and is being seen today for ongoing chest pain.  HPI: Chest Pain  This is a recurrent problem. The current episode started in the past 7 days. The onset quality is gradual. The problem has been waxing and waning. The pain is present in the substernal region. The quality of the pain is described as burning. Associated symptoms include back pain. Pertinent negatives include no irregular heartbeat. The pain is aggravated by nothing. He has tried rest (Pepcid) for the symptoms. The treatment provided no relief. Risk factors include male gender.  His past medical history is significant for anxiety/panic attacks.    Problems:  Patient Active Problem List   Diagnosis Date Noted   Pure hypercholesterolemia 04/20/2024   Obesity (BMI 30-39.9) 12/04/2023   GAD (generalized anxiety disorder) 01/26/2022   Migraine without aura and without status migrainosus, not intractable 01/26/2022   Extrinsic asthma 11/09/2020   Pain in left shin 12/27/2017   Situational anxiety 10/16/2017   Right knee pain 12/19/2016   Nephrolithiasis 10/29/2016    Allergies: Allergies[1] Medications: Current Medications[2]  Observations/Objective: Patient is well-developed, well-nourished in no acute distress.  Resting comfortably in his car Head  is normocephalic, atraumatic.  No labored breathing.  Speech is clear and coherent with logical content.  Patient is alert and oriented at baseline.    Assessment and Plan: 1. Chest pain, unspecified type (Primary)  Patient presenting with chest pain. Advised that we are unable to rule out ACS from a  telehealth standpoint and that he would need to report the nearest ER for evaluation. Patient agreed with this plan.   Follow Up Instructions: I discussed the assessment and treatment plan with the patient. The patient was provided an opportunity to ask questions and all were answered. The patient agreed with the plan and demonstrated an understanding of the instructions.  A copy of instructions were sent to the patient via MyChart unless otherwise noted below.    The patient was advised to call back or seek an in-person evaluation if the symptoms worsen or if the condition fails to improve as anticipated.    Hessie Varone, PA-C    [1] No Known Allergies [2]  Current Outpatient Medications:    mupirocin  ointment (BACTROBAN ) 2 %, Apply 1 Application topically 2 (two) times daily., Disp: 22 g, Rfl: 0   rizatriptan  (MAXALT ) 10 MG tablet, Take 1 tablet (10 mg total) by mouth as needed for migraine. May repeat in 2 hours if needed, Disp: 10 tablet, Rfl: 3   rosuvastatin  (CRESTOR ) 5 MG tablet, Take 1 tablet (5 mg total) by mouth daily., Disp: 90 tablet, Rfl: 3  "

## 2024-10-09 NOTE — Patient Instructions (Signed)
" °  Vernon Francis, thank you for joining Vernon Shuck, PA-C for today's virtual visit.  While this provider is not your primary care provider (PCP), if your PCP is located in our provider database this encounter information will be shared with them immediately following your visit.   A [facility] MyChart account gives you access to today's visit and all your visits, tests, and labs performed at [facility]  click here if you don't have a [facility] MyChart account or go to [email or web address]  Consent: (Patient) Vernon Francis provided verbal consent for this virtual visit at the beginning of the encounter.  Current Medications:  Current Outpatient Medications:    mupirocin  ointment (BACTROBAN ) 2 %, Apply 1 Application topically 2 (two) times daily., Disp: 22 g, Rfl: 0   rizatriptan  (MAXALT ) 10 MG tablet, Take 1 tablet (10 mg total) by mouth as needed for migraine. May repeat in 2 hours if needed, Disp: 10 tablet, Rfl: 3   rosuvastatin  (CRESTOR ) 5 MG tablet, Take 1 tablet (5 mg total) by mouth daily., Disp: 90 tablet, Rfl: 3   Medications ordered in this encounter:  No orders of the defined types were placed in this encounter.    *If you need refills on other medications prior to your next appointment, please contact your pharmacy*  Follow-Up: Call back or seek an in-person evaluation if the symptoms worsen or if the condition fails to improve as anticipated.  [facility] [phone]  Other Instructions Report to ER   If you have been instructed to have an in-person evaluation today at a local Urgent Care facility, please use the link below. It will take you to a list of all of our available [facility], including address, phone number and hours of operation. Please do not delay care.  [facility]  If you or a family member do not have a primary care provider, use the link below to schedule a visit and establish  care. When you choose a [facility] primary care physician or advanced practice provider, you gain a long-term partner in health. Find a Primary Care Provider  Learn more about [name] in-office and virtual care options:  - Get Care Now  "

## 2024-10-13 ENCOUNTER — Encounter: Payer: Self-pay | Admitting: Family Medicine

## 2024-10-13 ENCOUNTER — Telehealth: Payer: Self-pay

## 2024-10-13 ENCOUNTER — Telehealth: Admitting: Family Medicine

## 2024-10-13 DIAGNOSIS — K219 Gastro-esophageal reflux disease without esophagitis: Secondary | ICD-10-CM | POA: Diagnosis not present

## 2024-10-13 DIAGNOSIS — Z1211 Encounter for screening for malignant neoplasm of colon: Secondary | ICD-10-CM

## 2024-10-13 MED ORDER — PANTOPRAZOLE SODIUM 40 MG PO TBEC: 40.0000 mg | DELAYED_RELEASE_TABLET | Freq: Every day | ORAL | 1 refills | Status: AC

## 2024-10-13 NOTE — Telephone Encounter (Signed)
 done

## 2024-10-13 NOTE — Progress Notes (Signed)
 CC: GERD  GERD Vernon Francis is a 44 y.o. male who presents for follow up of GERD. We are interacting via web portal for an electronic face-to-face visit. I verified patient's ID using 2 identifiers. Patient agreed to proceed with visit via this method. Patient is in a parked car, I am at office. Patient and I are present for visit.  Takes Pepcid 20 mg bid prn, Tums prn.  Failed omeprazole .  Going on for around 1 mo.  He denies heartburn, chest pain Aggravating factors and specific triggers include large meals, lying down, and spicy foods. Alleviating factors include antacids Risk factors present for GERD include obesity.   Past Medical History:  Diagnosis Date   HSV-1 (herpes simplex virus 1) infection    HSV-2 infection    Kidney stones     Exam No conversational dyspnea Age appropriate judgment and insight Nml affect and mood  Assessment and Plan  Gastroesophageal reflux disease, unspecified whether esophagitis present  Screen for colon cancer - Plan: Ambulatory referral to Gastroenterology  Chronic, not controlled. Start Protonix  40 mg/d. Cont Pepcid prn, Tums prn. Counseled on GERD diet and precautions. Refer to GI for CCS, will probably line up with failure or success of PPI also.  Follow up as originally scheduled. Pt voiced understanding and agreement to the plan.  Mabel Mt Faxon, DO 10/13/24  1:45 PM

## 2024-10-14 ENCOUNTER — Inpatient Hospital Stay: Admitting: Family Medicine
